# Patient Record
Sex: Female | Born: 1990 | Race: White | Hispanic: No | Marital: Single | Smoking: Never smoker
Health system: Southern US, Community
[De-identification: ages and names within clinical notes are randomized; demographics above are authoritative.]

---
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
## 2010-08-02 ENCOUNTER — Emergency Department (HOSPITAL_COMMUNITY): Admission: EM | Admit: 2010-08-02 | Discharge: 2010-08-02 | Payer: Self-pay | Admitting: Emergency Medicine

## 2011-04-23 ENCOUNTER — Other Ambulatory Visit: Payer: Self-pay | Admitting: Family Medicine

## 2013-07-01 ENCOUNTER — Other Ambulatory Visit (HOSPITAL_COMMUNITY)
Admission: RE | Admit: 2013-07-01 | Discharge: 2013-07-01 | Disposition: A | Discharge status: Home / Self Care | Payer: 59 | Source: Ambulatory Visit | Attending: Family Medicine | Admitting: Family Medicine

## 2013-07-01 ENCOUNTER — Other Ambulatory Visit: Payer: Self-pay | Admitting: Family Medicine

## 2013-07-01 DIAGNOSIS — Z124 Encounter for screening for malignant neoplasm of cervix: Secondary | ICD-10-CM | POA: Insufficient documentation

## 2013-07-01 DIAGNOSIS — Z1151 Encounter for screening for human papillomavirus (HPV): Secondary | ICD-10-CM | POA: Insufficient documentation

## 2016-11-02 DIAGNOSIS — R319 Hematuria, unspecified: Secondary | ICD-10-CM | POA: Diagnosis not present

## 2016-11-06 DIAGNOSIS — N6321 Unspecified lump in the left breast, upper outer quadrant: Secondary | ICD-10-CM | POA: Diagnosis not present

## 2016-12-11 DIAGNOSIS — L853 Xerosis cutis: Secondary | ICD-10-CM | POA: Diagnosis not present

## 2016-12-11 DIAGNOSIS — F419 Anxiety disorder, unspecified: Secondary | ICD-10-CM | POA: Diagnosis not present

## 2016-12-11 DIAGNOSIS — L709 Acne, unspecified: Secondary | ICD-10-CM | POA: Diagnosis not present

## 2016-12-11 DIAGNOSIS — F329 Major depressive disorder, single episode, unspecified: Secondary | ICD-10-CM | POA: Diagnosis not present

## 2016-12-11 DIAGNOSIS — E559 Vitamin D deficiency, unspecified: Secondary | ICD-10-CM | POA: Diagnosis not present

## 2017-01-08 DIAGNOSIS — F419 Anxiety disorder, unspecified: Secondary | ICD-10-CM | POA: Diagnosis not present

## 2017-01-08 DIAGNOSIS — L709 Acne, unspecified: Secondary | ICD-10-CM | POA: Diagnosis not present

## 2017-01-08 DIAGNOSIS — L853 Xerosis cutis: Secondary | ICD-10-CM | POA: Diagnosis not present

## 2017-05-09 DIAGNOSIS — L709 Acne, unspecified: Secondary | ICD-10-CM | POA: Diagnosis not present

## 2017-05-09 DIAGNOSIS — N6321 Unspecified lump in the left breast, upper outer quadrant: Secondary | ICD-10-CM | POA: Diagnosis not present

## 2017-05-09 DIAGNOSIS — N6312 Unspecified lump in the right breast, upper inner quadrant: Secondary | ICD-10-CM | POA: Diagnosis not present

## 2017-05-09 DIAGNOSIS — L853 Xerosis cutis: Secondary | ICD-10-CM | POA: Diagnosis not present

## 2017-05-09 DIAGNOSIS — E559 Vitamin D deficiency, unspecified: Secondary | ICD-10-CM | POA: Diagnosis not present

## 2017-05-09 DIAGNOSIS — F329 Major depressive disorder, single episode, unspecified: Secondary | ICD-10-CM | POA: Diagnosis not present

## 2017-05-09 DIAGNOSIS — F419 Anxiety disorder, unspecified: Secondary | ICD-10-CM | POA: Diagnosis not present

## 2017-05-14 DIAGNOSIS — F329 Major depressive disorder, single episode, unspecified: Secondary | ICD-10-CM | POA: Diagnosis not present

## 2017-05-14 DIAGNOSIS — L709 Acne, unspecified: Secondary | ICD-10-CM | POA: Diagnosis not present

## 2017-05-14 DIAGNOSIS — L853 Xerosis cutis: Secondary | ICD-10-CM | POA: Diagnosis not present

## 2017-05-14 DIAGNOSIS — F419 Anxiety disorder, unspecified: Secondary | ICD-10-CM | POA: Diagnosis not present

## 2017-08-05 DIAGNOSIS — J029 Acute pharyngitis, unspecified: Secondary | ICD-10-CM | POA: Diagnosis not present

## 2017-08-06 DIAGNOSIS — B349 Viral infection, unspecified: Secondary | ICD-10-CM | POA: Diagnosis not present

## 2017-12-19 ENCOUNTER — Other Ambulatory Visit: Payer: Self-pay | Admitting: Family Medicine

## 2017-12-19 ENCOUNTER — Other Ambulatory Visit (HOSPITAL_COMMUNITY)
Admission: RE | Admit: 2017-12-19 | Discharge: 2017-12-19 | Disposition: A | Discharge status: Home / Self Care | Payer: BLUE CROSS/BLUE SHIELD | Source: Ambulatory Visit | Attending: Family Medicine | Admitting: Family Medicine

## 2017-12-19 DIAGNOSIS — Z124 Encounter for screening for malignant neoplasm of cervix: Secondary | ICD-10-CM | POA: Diagnosis not present

## 2017-12-19 DIAGNOSIS — Z131 Encounter for screening for diabetes mellitus: Secondary | ICD-10-CM | POA: Diagnosis not present

## 2017-12-19 DIAGNOSIS — Z0000 Encounter for general adult medical examination without abnormal findings: Secondary | ICD-10-CM | POA: Diagnosis not present

## 2017-12-24 LAB — CYTOLOGY - PAP
DIAGNOSIS: NEGATIVE
HPV (WINDOPATH): NOT DETECTED

## 2018-05-15 DIAGNOSIS — N6310 Unspecified lump in the right breast, unspecified quadrant: Secondary | ICD-10-CM | POA: Diagnosis not present

## 2018-05-15 DIAGNOSIS — N6321 Unspecified lump in the left breast, upper outer quadrant: Secondary | ICD-10-CM | POA: Diagnosis not present

## 2018-06-02 DIAGNOSIS — D2272 Melanocytic nevi of left lower limb, including hip: Secondary | ICD-10-CM | POA: Diagnosis not present

## 2018-06-02 DIAGNOSIS — D2271 Melanocytic nevi of right lower limb, including hip: Secondary | ICD-10-CM | POA: Diagnosis not present

## 2018-06-02 DIAGNOSIS — L700 Acne vulgaris: Secondary | ICD-10-CM | POA: Diagnosis not present

## 2018-06-02 DIAGNOSIS — D225 Melanocytic nevi of trunk: Secondary | ICD-10-CM | POA: Diagnosis not present

## 2018-06-02 DIAGNOSIS — D485 Neoplasm of uncertain behavior of skin: Secondary | ICD-10-CM | POA: Diagnosis not present

## 2018-12-25 DIAGNOSIS — Z0000 Encounter for general adult medical examination without abnormal findings: Secondary | ICD-10-CM | POA: Diagnosis not present

## 2018-12-25 DIAGNOSIS — D241 Benign neoplasm of right breast: Secondary | ICD-10-CM | POA: Diagnosis not present

## 2018-12-25 DIAGNOSIS — G43909 Migraine, unspecified, not intractable, without status migrainosus: Secondary | ICD-10-CM | POA: Diagnosis not present

## 2018-12-25 DIAGNOSIS — F411 Generalized anxiety disorder: Secondary | ICD-10-CM | POA: Diagnosis not present

## 2018-12-25 DIAGNOSIS — Z79899 Other long term (current) drug therapy: Secondary | ICD-10-CM | POA: Diagnosis not present

## 2019-10-26 DIAGNOSIS — Z20828 Contact with and (suspected) exposure to other viral communicable diseases: Secondary | ICD-10-CM | POA: Diagnosis not present

## 2020-01-06 DIAGNOSIS — Z79899 Other long term (current) drug therapy: Secondary | ICD-10-CM | POA: Diagnosis not present

## 2020-01-06 DIAGNOSIS — Z0000 Encounter for general adult medical examination without abnormal findings: Secondary | ICD-10-CM | POA: Diagnosis not present

## 2020-01-06 DIAGNOSIS — R5383 Other fatigue: Secondary | ICD-10-CM | POA: Diagnosis not present

## 2020-02-22 DIAGNOSIS — R5383 Other fatigue: Secondary | ICD-10-CM | POA: Diagnosis not present

## 2020-02-22 DIAGNOSIS — K130 Diseases of lips: Secondary | ICD-10-CM | POA: Diagnosis not present

## 2020-02-22 DIAGNOSIS — E559 Vitamin D deficiency, unspecified: Secondary | ICD-10-CM | POA: Diagnosis not present

## 2020-02-22 DIAGNOSIS — E639 Nutritional deficiency, unspecified: Secondary | ICD-10-CM | POA: Diagnosis not present

## 2020-06-13 DIAGNOSIS — Z20828 Contact with and (suspected) exposure to other viral communicable diseases: Secondary | ICD-10-CM | POA: Diagnosis not present

## 2020-06-24 DIAGNOSIS — E559 Vitamin D deficiency, unspecified: Secondary | ICD-10-CM | POA: Diagnosis not present

## 2020-06-24 DIAGNOSIS — F419 Anxiety disorder, unspecified: Secondary | ICD-10-CM | POA: Diagnosis not present

## 2020-06-24 DIAGNOSIS — R5383 Other fatigue: Secondary | ICD-10-CM | POA: Diagnosis not present

## 2021-02-20 DIAGNOSIS — G43909 Migraine, unspecified, not intractable, without status migrainosus: Secondary | ICD-10-CM | POA: Diagnosis not present

## 2021-02-20 DIAGNOSIS — Z13220 Encounter for screening for lipoid disorders: Secondary | ICD-10-CM | POA: Diagnosis not present

## 2021-02-20 DIAGNOSIS — Z79899 Other long term (current) drug therapy: Secondary | ICD-10-CM | POA: Diagnosis not present

## 2021-02-20 DIAGNOSIS — Z0000 Encounter for general adult medical examination without abnormal findings: Secondary | ICD-10-CM | POA: Diagnosis not present

## 2021-03-22 DIAGNOSIS — D2262 Melanocytic nevi of left upper limb, including shoulder: Secondary | ICD-10-CM | POA: Diagnosis not present

## 2021-03-22 DIAGNOSIS — D2261 Melanocytic nevi of right upper limb, including shoulder: Secondary | ICD-10-CM | POA: Diagnosis not present

## 2021-03-22 DIAGNOSIS — D2221 Melanocytic nevi of right ear and external auricular canal: Secondary | ICD-10-CM | POA: Diagnosis not present

## 2021-03-22 DIAGNOSIS — D224 Melanocytic nevi of scalp and neck: Secondary | ICD-10-CM | POA: Diagnosis not present

## 2021-03-22 DIAGNOSIS — D225 Melanocytic nevi of trunk: Secondary | ICD-10-CM | POA: Diagnosis not present

## 2021-10-04 DIAGNOSIS — E559 Vitamin D deficiency, unspecified: Secondary | ICD-10-CM | POA: Diagnosis not present

## 2021-10-04 DIAGNOSIS — R454 Irritability and anger: Secondary | ICD-10-CM | POA: Diagnosis not present

## 2021-10-04 DIAGNOSIS — E538 Deficiency of other specified B group vitamins: Secondary | ICD-10-CM | POA: Diagnosis not present

## 2021-10-04 DIAGNOSIS — F419 Anxiety disorder, unspecified: Secondary | ICD-10-CM | POA: Diagnosis not present

## 2021-10-04 DIAGNOSIS — R5383 Other fatigue: Secondary | ICD-10-CM | POA: Diagnosis not present

## 2021-10-16 DIAGNOSIS — F419 Anxiety disorder, unspecified: Secondary | ICD-10-CM | POA: Diagnosis not present

## 2021-10-16 DIAGNOSIS — R5383 Other fatigue: Secondary | ICD-10-CM | POA: Diagnosis not present

## 2021-11-08 DIAGNOSIS — E538 Deficiency of other specified B group vitamins: Secondary | ICD-10-CM | POA: Diagnosis not present

## 2021-11-08 DIAGNOSIS — E612 Magnesium deficiency: Secondary | ICD-10-CM | POA: Diagnosis not present

## 2021-11-08 DIAGNOSIS — F419 Anxiety disorder, unspecified: Secondary | ICD-10-CM | POA: Diagnosis not present

## 2021-11-08 DIAGNOSIS — R5383 Other fatigue: Secondary | ICD-10-CM | POA: Diagnosis not present

## 2021-11-08 DIAGNOSIS — E559 Vitamin D deficiency, unspecified: Secondary | ICD-10-CM | POA: Diagnosis not present

## 2021-11-15 ENCOUNTER — Encounter: Payer: Self-pay | Admitting: Cardiovascular Disease

## 2021-11-15 ENCOUNTER — Ambulatory Visit: Payer: BC Managed Care – PPO | Admitting: Cardiovascular Disease

## 2021-11-15 ENCOUNTER — Other Ambulatory Visit: Payer: Self-pay

## 2021-11-15 VITALS — BP 108/60 | HR 104 | Ht 62.0 in | Wt 94.0 lb

## 2021-11-15 DIAGNOSIS — R072 Precordial pain: Secondary | ICD-10-CM

## 2021-11-15 NOTE — Patient Instructions (Signed)
Medication Instructions:  The current medical regimen is effective;  continue present plan and medications.  *If you need a refill on your cardiac medications before your next appointment, please call your pharmacy*  Lab: ESR, CRP today    Testing/Procedures: Echocardiogram - Your physician has requested that you have an echocardiogram. Echocardiography is a painless test that uses sound waves to create images of your heart. It provides your doctor with information about the size and shape of your heart and how well your hearts chambers and valves are working. This procedure takes approximately one hour. There are no restrictions for this procedure. This will be performed at either our [facility] location - [address], [place] location [facility] 2nd floor.    Follow-Up: At [facility], you and your health needs are our priority.  As part of our continuing mission to provide you with exceptional heart care, we have created designated Provider Care Teams.  These Care Teams include your primary Cardiologist (physician) and Advanced Practice Providers (APPs -  Physician Assistants and Nurse Practitioners) who all work together to provide you with the care you need, when you need it.  We recommend signing up for the patient portal called "MyChart".  Sign up information is provided on this After Visit Summary.  MyChart is used to connect with patients for Virtual Visits (Telemedicine).  Patients are able to view lab/test results, encounter notes, upcoming appointments, etc.  Non-urgent messages can be sent to your provider as well.   To learn more about what you can do with MyChart, go to [email or web address].    Your next appointment:   As needed  The format for your next appointment:   In Person  Provider:   Eleonore Chiquito, MD

## 2021-11-15 NOTE — Progress Notes (Signed)
Cardiology Office Note:   Date:  11/15/2021  NAME:  Kimberly Porter    MRN: [id number] DOB:  [birth year]   PCP:  Mayra Neer, MD  Cardiologist:  None  Electrophysiologist:  None   Referring MD: No ref. provider found   Chief Complaint  Patient presents with   Chest Pain    History of Present Illness:   Kimberly Porter is a 31 y.o. female without significant medical history who presents for evaluation of chest pain at the request of Serita Grammes, MD.  She reports she developed COVID-19 infection on 10/24/2021.  Symptoms were cough, fever and fatigue.  Symptoms of fever lasted roughly 3 days.  She did have cough and upper respiratory symptoms for roughly 5 to 7 days.  She reports she had no chest pain or no significant shortness of breath.  She reports she was fatigued and did a round of steroids as well as ivermectin HCQ.  She reports roughly 3 to 4 days ago she developed tightness in her chest.  She describes a squeezing sensation in her right chest.  She reports that symptoms would also occur in her arms as well as left chest.  She also describes squeezing sensation in her arms.  Can occur in legs as well.  She reports she has anxiety.  She reports she is more anxious with her symptoms.  She reports no positional component to the symptoms.  They apparently are occurring more at night.  She reports she is thinking more about her symptoms.  She denies any significant shortness of breath.  She has no lower extremity edema.  She denies any weight gain.  She was seen by functional medicine doctor in [place].  Laboratory work was unremarkable.  She has continued to have symptoms and is quite concerned she may have sinister pathology such as myocarditis or congestive heart failure.  Her EKG in office demonstrates sinus tachycardia heart rate 104.  She does report sensation of rapid heartbeat sensation when she has her symptoms.  She has this all the time with anxiety symptoms.  Nothing that  appears to be new.  Her EKG demonstrates an RSR prime pattern.  She has no evidence of abnormality on cardiovascular exam today.  She has no medical problems.  She does not smoke drink alcohol or use drugs.  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  She works part-time as a International aid/development worker.  She denies any other symptoms in office today.  She presents with her mother.  There is a family of history of heart disease in her grandparents.  She really has no medical troubles.  Past Medical History: History reviewed. No pertinent past medical history.  Past Surgical History: History reviewed. No pertinent surgical history.  Current Medications: No outpatient medications have been marked as taking for the 11/15/21 encounter (Office Visit) with Geralynn Rile, MD.     Allergies:    Patient has no allergy information on record.   Social History: Social History   Socioeconomic History   Marital status: Single    Spouse name: Not on file   Number of children: Not on file   Years of education: Not on file   Highest education level: Not on file  Occupational History   Occupation: Glass blower/designer  Tobacco Use   Smoking status: Never   Smokeless tobacco: Never  Substance and Sexual Activity   Alcohol use: Not Currently   Drug use: Never   Sexual activity: Not Currently  Other Topics Concern   Not on file  Social History Narrative   Not on file   Social Determinants of Health   Financial Resource Strain: Not on file  Food Insecurity: Not on file  Transportation Needs: Not on file  Physical Activity: Not on file  Stress: Not on file  Social Connections: Not on file     Family History: The patient's family history includes Heart disease in her maternal grandfather and maternal grandmother.  ROS:   All other ROS reviewed and negative. Pertinent positives noted in the HPI.    EKG: An EKG was ordered in the office today.  This EKG demonstrates sinus tachycardia heart rate  104, RSR prime pattern is present, normal finding in a young person.  This EKG was personally reviewed by me.  Recent Labs: No results found for requested labs within last 8760 hours.   Recent Lipid Panel No results found for: CHOL, TRIG, HDL, CHOLHDL, VLDL, LDLCALC, LDLDIRECT  Physical Exam:   VS:  BP 108/60    Pulse (!) 104    Ht '5\' 2"'  (1.575 m)    Wt 94 lb (42.6 kg)    SpO2 99%    BMI 17.19 kg/m    Wt Readings from Last 3 Encounters:  11/15/21 94 lb (42.6 kg)    General: Well nourished, well developed, in no acute distress Head: Atraumatic, normal size  Eyes: PEERLA, EOMI  Neck: Supple, no JVD Endocrine: No thryomegaly Cardiac: Normal S1, S2; RRR; no murmurs, rubs, or gallops Lungs: Clear to auscultation bilaterally, no wheezing, rhonchi or rales  Abd: Soft, nontender, no hepatomegaly  Ext: No edema, pulses 2+ Musculoskeletal: No deformities, BUE and BLE strength normal and equal Skin: Warm and dry, no rashes   Neuro: Alert and oriented to person, place, time, and situation, CNII-XII grossly intact, no focal deficits  Psych: Normal mood and affect   ASSESSMENT:   Kimberly Porter is a 31 y.o. female who presents for the following: 1. Precordial pain     PLAN:   1. Precordial pain -She describes chest symptoms after COVID-19 infection.  She really had upper respiratory infection symptoms.  She had no symptoms of chest pain or trouble breathing when she was originally infected.  She now describes nonspecific squeezing in her arms and legs as well as chest.  To me this is noncardiac chest pain.  She has no positional component to suggest pericarditis.  She has no evidence of congestive heart failure.  Her overall examination is normal.  Her EKG demonstrates an RSR prime pattern which is a normal finding in a young healthy person.  She has no pre-existing medical problems.  I believe her symptoms could be inflammatory related.  I would like to check an ESR and CRP.  We will also  check an echocardiogram just to make sure her heart has not been affected.  I highly doubt it will be abnormal.  For now she can take ibuprofen and Tylenol as needed.  If her inflammatory markers are elevated we may treat her for pericarditis however symptoms are inconsistent with this.  Further instruction will be after her lab work and echocardiogram.  For now I suspect this will resolve.    Disposition: Return in about 1 year (around 11/15/2022).  Medication Adjustments/Labs and Tests Ordered: Current medicines are reviewed at length with the patient today.  Concerns regarding medicines are outlined above.  Orders Placed This Encounter  Procedures   Sedimentation rate   C-reactive protein  EKG 12-Lead   ECHOCARDIOGRAM COMPLETE   No orders of the defined types were placed in this encounter.   Patient Instructions  Medication Instructions:  The current medical regimen is effective;  continue present plan and medications.  *If you need a refill on your cardiac medications before your next appointment, please call your pharmacy*  Lab: ESR, CRP today    Testing/Procedures: Echocardiogram - Your physician has requested that you have an echocardiogram. Echocardiography is a painless test that uses sound waves to create images of your heart. It provides your doctor with information about the size and shape of your heart and how well your hearts chambers and valves are working. This procedure takes approximately one hour. There are no restrictions for this procedure. This will be performed at either our [facility] location - [address], [place] location [facility] 2nd floor.    Follow-Up: At [facility], you and your health needs are our priority.  As part of our continuing mission to provide you with exceptional heart care, we have created designated Provider Care Teams.  These Care Teams include your primary Cardiologist (physician) and Advanced  Practice Providers (APPs -  Physician Assistants and Nurse Practitioners) who all work together to provide you with the care you need, when you need it.  We recommend signing up for the patient portal called "MyChart".  Sign up information is provided on this After Visit Summary.  MyChart is used to connect with patients for Virtual Visits (Telemedicine).  Patients are able to view lab/test results, encounter notes, upcoming appointments, etc.  Non-urgent messages can be sent to your provider as well.   To learn more about what you can do with MyChart, go to [email or web address].    Your next appointment:   As needed  The format for your next appointment:   In Person  Provider:   Eleonore Chiquito, MD      Signed, Addison Naegeli. Audie Box, MD, [place]  [address], [place], [place] [address] [phone]  11/15/2021 11:19 AM

## 2021-11-16 ENCOUNTER — Ambulatory Visit (INDEPENDENT_AMBULATORY_CARE_PROVIDER_SITE_OTHER): Payer: BC Managed Care – PPO

## 2021-11-16 DIAGNOSIS — R072 Precordial pain: Secondary | ICD-10-CM

## 2021-11-16 LAB — ECHOCARDIOGRAM COMPLETE
AR max vel: 2.04 cm2
AV Area VTI: 1.91 cm2
AV Area mean vel: 2.02 cm2
AV Mean grad: 5 mmHg
AV Peak grad: 8.9 mmHg
Ao pk vel: 1.49 m/s
Area-P 1/2: 5.88 cm2
Calc EF: 70.7 %
S' Lateral: 2.74 cm
Single Plane A2C EF: 63.3 %
Single Plane A4C EF: 78.3 %

## 2021-11-16 LAB — SEDIMENTATION RATE: Sed Rate: 2 mm/hr (ref 0–32)

## 2021-11-16 LAB — C-REACTIVE PROTEIN: CRP: <1 mg/L (ref 0–10)

## 2021-11-17 ENCOUNTER — Encounter: Payer: Self-pay | Admitting: Cardiovascular Disease

## 2021-11-21 ENCOUNTER — Other Ambulatory Visit (HOSPITAL_COMMUNITY): Payer: BC Managed Care – PPO

## 2021-12-26 DIAGNOSIS — R195 Other fecal abnormalities: Secondary | ICD-10-CM | POA: Diagnosis not present

## 2021-12-26 DIAGNOSIS — R634 Abnormal weight loss: Secondary | ICD-10-CM | POA: Diagnosis not present

## 2021-12-27 DIAGNOSIS — F411 Generalized anxiety disorder: Secondary | ICD-10-CM | POA: Diagnosis not present

## 2021-12-28 ENCOUNTER — Other Ambulatory Visit: Payer: Self-pay | Admitting: Internal Medicine

## 2021-12-28 DIAGNOSIS — R634 Abnormal weight loss: Secondary | ICD-10-CM | POA: Diagnosis not present

## 2021-12-28 DIAGNOSIS — R195 Other fecal abnormalities: Secondary | ICD-10-CM | POA: Diagnosis not present

## 2022-01-02 ENCOUNTER — Ambulatory Visit
Admission: RE | Admit: 2022-01-02 | Discharge: 2022-01-02 | Disposition: A | Discharge status: Home / Self Care | Payer: BC Managed Care – PPO | Source: Ambulatory Visit | Attending: Internal Medicine | Admitting: Internal Medicine

## 2022-01-02 DIAGNOSIS — K909 Intestinal malabsorption, unspecified: Secondary | ICD-10-CM | POA: Diagnosis not present

## 2022-01-02 DIAGNOSIS — R195 Other fecal abnormalities: Secondary | ICD-10-CM

## 2022-01-03 ENCOUNTER — Other Ambulatory Visit: Payer: BC Managed Care – PPO

## 2022-01-04 DIAGNOSIS — F411 Generalized anxiety disorder: Secondary | ICD-10-CM | POA: Diagnosis not present

## 2022-01-15 DIAGNOSIS — R5383 Other fatigue: Secondary | ICD-10-CM | POA: Diagnosis not present

## 2022-01-15 DIAGNOSIS — E538 Deficiency of other specified B group vitamins: Secondary | ICD-10-CM | POA: Diagnosis not present

## 2022-01-15 DIAGNOSIS — F419 Anxiety disorder, unspecified: Secondary | ICD-10-CM | POA: Diagnosis not present

## 2022-01-15 DIAGNOSIS — E559 Vitamin D deficiency, unspecified: Secondary | ICD-10-CM | POA: Diagnosis not present

## 2022-01-23 DIAGNOSIS — F411 Generalized anxiety disorder: Secondary | ICD-10-CM | POA: Diagnosis not present

## 2022-01-30 DIAGNOSIS — F411 Generalized anxiety disorder: Secondary | ICD-10-CM | POA: Diagnosis not present

## 2022-02-05 DIAGNOSIS — F411 Generalized anxiety disorder: Secondary | ICD-10-CM | POA: Diagnosis not present

## 2022-02-19 DIAGNOSIS — F419 Anxiety disorder, unspecified: Secondary | ICD-10-CM | POA: Diagnosis not present

## 2022-02-19 DIAGNOSIS — Z1329 Encounter for screening for other suspected endocrine disorder: Secondary | ICD-10-CM | POA: Diagnosis not present

## 2022-02-19 DIAGNOSIS — R5383 Other fatigue: Secondary | ICD-10-CM | POA: Diagnosis not present

## 2022-02-19 DIAGNOSIS — R140 Abdominal distension (gaseous): Secondary | ICD-10-CM | POA: Diagnosis not present

## 2022-02-26 DIAGNOSIS — F411 Generalized anxiety disorder: Secondary | ICD-10-CM | POA: Diagnosis not present

## 2022-03-06 DIAGNOSIS — Z803 Family history of malignant neoplasm of breast: Secondary | ICD-10-CM | POA: Diagnosis not present

## 2022-03-06 DIAGNOSIS — R922 Inconclusive mammogram: Secondary | ICD-10-CM | POA: Diagnosis not present

## 2022-03-08 DIAGNOSIS — F411 Generalized anxiety disorder: Secondary | ICD-10-CM | POA: Diagnosis not present

## 2022-03-12 DIAGNOSIS — F411 Generalized anxiety disorder: Secondary | ICD-10-CM | POA: Diagnosis not present

## 2022-03-19 DIAGNOSIS — F411 Generalized anxiety disorder: Secondary | ICD-10-CM | POA: Diagnosis not present

## 2022-03-29 DIAGNOSIS — F411 Generalized anxiety disorder: Secondary | ICD-10-CM | POA: Diagnosis not present

## 2022-04-06 DIAGNOSIS — F411 Generalized anxiety disorder: Secondary | ICD-10-CM | POA: Diagnosis not present

## 2022-04-18 DIAGNOSIS — F4010 Social phobia, unspecified: Secondary | ICD-10-CM | POA: Diagnosis not present

## 2022-04-18 DIAGNOSIS — F411 Generalized anxiety disorder: Secondary | ICD-10-CM | POA: Diagnosis not present

## 2022-04-20 DIAGNOSIS — E559 Vitamin D deficiency, unspecified: Secondary | ICD-10-CM | POA: Diagnosis not present

## 2022-04-20 DIAGNOSIS — F419 Anxiety disorder, unspecified: Secondary | ICD-10-CM | POA: Diagnosis not present

## 2022-04-20 DIAGNOSIS — E538 Deficiency of other specified B group vitamins: Secondary | ICD-10-CM | POA: Diagnosis not present

## 2022-04-20 DIAGNOSIS — R5383 Other fatigue: Secondary | ICD-10-CM | POA: Diagnosis not present

## 2022-04-20 DIAGNOSIS — Z1329 Encounter for screening for other suspected endocrine disorder: Secondary | ICD-10-CM | POA: Diagnosis not present

## 2022-05-04 DIAGNOSIS — F411 Generalized anxiety disorder: Secondary | ICD-10-CM | POA: Diagnosis not present

## 2022-05-17 DIAGNOSIS — F411 Generalized anxiety disorder: Secondary | ICD-10-CM | POA: Diagnosis not present

## 2022-06-01 DIAGNOSIS — F411 Generalized anxiety disorder: Secondary | ICD-10-CM | POA: Diagnosis not present

## 2022-06-05 ENCOUNTER — Ambulatory Visit (INDEPENDENT_AMBULATORY_CARE_PROVIDER_SITE_OTHER): Payer: BC Managed Care – PPO | Admitting: Behavioral Health

## 2022-06-05 ENCOUNTER — Encounter: Payer: Self-pay | Admitting: Behavioral Health

## 2022-06-05 VITALS — BP 108/65 | HR 77 | Ht 61.0 in | Wt 93.0 lb

## 2022-06-05 DIAGNOSIS — F411 Generalized anxiety disorder: Secondary | ICD-10-CM | POA: Diagnosis not present

## 2022-06-05 DIAGNOSIS — R636 Underweight: Secondary | ICD-10-CM

## 2022-06-05 DIAGNOSIS — F331 Major depressive disorder, recurrent, moderate: Secondary | ICD-10-CM

## 2022-06-05 DIAGNOSIS — F5105 Insomnia due to other mental disorder: Secondary | ICD-10-CM | POA: Diagnosis not present

## 2022-06-05 DIAGNOSIS — F99 Mental disorder, not otherwise specified: Secondary | ICD-10-CM

## 2022-06-05 MED ORDER — MIRTAZAPINE 15 MG PO TABS: 15.0000 mg | ORAL_TABLET | Freq: Every day | ORAL | 1 refills | Status: DC

## 2022-06-05 MED ORDER — SERTRALINE HCL 100 MG PO TABS: 100.0000 mg | ORAL_TABLET | Freq: Every day | ORAL | 1 refills | Status: DC

## 2022-06-05 NOTE — Progress Notes (Signed)
[facility] MD/PA/NP Initial Note  06/05/2022 5:36 PM ARDELLE HALIBURTON  MRN:  [id number]  Chief Complaint:  Chief Complaint   Depression; Anxiety; Establish Care; Medication Problem; Patient Education; Weight Loss     HPI:   "[name]", 31 year old female presents to this office for initial visit and to establish care.  Her mother is present with her during interview with her verbal consent.  She says that she has struggled with anxiety and depression since the fifth grade.  Says it progressively become worse throughout teen years, but she eventually stopped taking medication for a period pre-college. She then reinitiated medication again during her college years when strong anxiety resurfaced. She says that her anxiety always seem to get worse when she was in transitional periods of life.  She says her anxiety becomes overwhelming at times and she has experienced panic attacks previously.  Says that her anxiety has caused her to become fixated on various health issues.  Says that she once went to the doctor complaining of chest pains and after a full cardiac work-up, it was determined that it was anxiety and panic.  She later developed abdominal distress which she also now feels is associated with anxiety.  She has been experiencing steady weight loss, and is now underweight at 93 pounds.  She says that she knows that she is not getting the right kind of nutrition or the amount of calories needed to be healthy.  She also has chronic sleeping problems over the last few years which she says has also attributed to increased anxiety.  Her PHQ-9 score today was 11.  She says her anxiety is 7 of 10.  Her depression is 3 of 10.  Reports sleeping 6 to 7 hours of broken sleep per night.  She has had a few prior medication trials since then.  She was receiving some holistic care recently and taking only 37.5 mg Zoloft. Her PCP had prescribed some Xanax for panic but says that she really has not wanted to utilize.   Her MDQ was grossly negative only endorsing racing thoughts.  She denies any history of mania, no psychosis, no history of auditory or visual hallucinations.  Denies SI or HI.  Past psychotropic medications: Doxepin Paxil Zoloft   Visit Diagnosis:    ICD-10-CM   1. Generalized anxiety disorder  F41.1 sertraline (ZOLOFT) 100 MG tablet    2. Major depressive disorder, recurrent episode, moderate (HCC)  F33.1 sertraline (ZOLOFT) 100 MG tablet    3. Insomnia due to other mental disorder  F51.05 mirtazapine (REMERON) 15 MG tablet   F99     4. Underweight  R63.6       Past Psychiatric History: Anxiety, panic attacks, MDD, Insomnia, underweight.   Past Medical History: History reviewed. No pertinent past medical history. History reviewed. No pertinent surgical history.  Family Psychiatric History: see chart  Family History:  Family History  Problem Relation Age of Onset   Depression Mother    Anxiety disorder Mother    Alcohol abuse Father    ADD / ADHD Father    Drug abuse Paternal Aunt    Alcohol abuse Paternal Aunt    Anxiety disorder Maternal Grandfather    Heart disease Maternal Grandfather    Anxiety disorder Maternal Grandmother    Heart disease Maternal Grandmother    Drug abuse Paternal Grandmother    Alcohol abuse Paternal Grandmother     Social History:  Social History   Socioeconomic History   Marital status: Single  Spouse name: Not on file   Number of children: Not on file   Years of education: 16   Highest education level: Bachelor's degree (e.g., BA, AB, BS)  Occupational History   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 Enjoys cooking, being outdoors, enjoys [employer].   Social Determinants of Health   Financial Resource Strain: Not on file  Food Insecurity: Not on file  Transportation Needs: Not on file  Physical Activity: Not on file  Stress: Not on file  Social Connections: Not on file    Allergies: Not on File  Metabolic Disorder Labs: No results found for: "HGBA1C", "MPG" No results found for: "PROLACTIN" No results found for: "CHOL", "TRIG", "HDL", "CHOLHDL", "VLDL", "LDLCALC" No results found for: "TSH"  Therapeutic Level Labs: No results found for: "LITHIUM" No results found for: "VALPROATE" No results found for: "CBMZ"  Current Medications: Current Outpatient Medications  Medication Sig Dispense Refill   mirtazapine (REMERON) 15 MG tablet Take 1 tablet (15 mg total) by mouth at bedtime. 30 tablet 1   sertraline (ZOLOFT) 100 MG tablet Take 1 tablet (100 mg total) by mouth daily. 30 tablet 1   No current facility-administered medications for this visit.    Medication Side Effects: none  Orders placed this visit:  No orders of the defined types were placed in this encounter.   Psychiatric Specialty Exam:  Review of Systems  Constitutional:  Positive for appetite change, fatigue and unexpected weight change.  HENT:  Positive for tinnitus.   Eyes:  Positive for visual disturbance.  Gastrointestinal:  Positive for constipation, diarrhea and nausea.  Neurological:  Positive for dizziness.  Psychiatric/Behavioral:  Positive for dysphoric mood and sleep disturbance. The patient is nervous/anxious.     Blood pressure 108/65, pulse 77, height 5\' 1"  (1.549 m), weight 93 lb (42.2 kg).Body mass index is 17.57 kg/m.  General Appearance: Casual, Neat, and Well Groomed  Eye Contact:  Good  Speech:  Clear and Coherent  Volume:  Normal  Mood:  Anxious and Depressed  Affect:  Congruent, Depressed, Tearful, and Anxious  Thought Process:  Coherent  Orientation:   Full (Time, Place, and Person)  Thought Content: Logical   Suicidal Thoughts:  No  Homicidal Thoughts:  No  Memory:  WNL  Judgement:  Good  Insight:  Good  Psychomotor Activity:  Normal  Concentration:  Concentration: Good  Recall:  Good  Fund of Knowledge: Good  Language: Good  Assets:  Desire for Improvement  ADL's:  Intact  Cognition: WNL  Prognosis:  Good   Screenings:  PHQ2-9    Flowsheet Row Office Visit from 06/05/2022 in [facility]  PHQ-2 Total Score 3  PHQ-9 Total Score 11       Receiving Psychotherapy: yes  Treatment Plan/Recommendations:  Greater than 50% of 60 min face to face time with patient was spent on counseling and coordination of care. We discussed her long hx of anxiety and depression stemming back to 5th grade. We discussed how anxiety worsens at intervals in life where she is transitioning. Talked about her  previous plan of care and prior medications. Discussed her current level of stability and her goals for care today. We reviewed medications and possible side effects to be aware of.  We discussed her continued weight loss and nutritional intake. She is currently underweight at 93 lbs. We talked about her diet and discussed Mirtazapine as a medication that may help with sleep as well as stimulate appetite, and help gain some weight.   We agreed to: Will increase Zoloft to 50 mg for 1 week, then 100 mg daily To start Mirtazapine 15 mg 30 min before bedtime for sleep. To report worsening symptoms or side effects promptly To follow up in 4 weeks to reassess Provided emergency contact information Reviewed PDMP.   Joan Flores, NP

## 2022-06-12 ENCOUNTER — Telehealth: Payer: Self-pay | Admitting: Behavioral Health

## 2022-06-12 NOTE — Telephone Encounter (Signed)
Told mom to call pharmacy and ask them.

## 2022-06-12 NOTE — Telephone Encounter (Signed)
Mentioned to Arlys John and he informed safe to take these 2 meds together.

## 2022-06-12 NOTE — Telephone Encounter (Signed)
Pt's mom Cala Bradford on [address] called asking if Mirtazapine can be taken with Magnesium. Pt takes 300 mg @ night. [employer] @ [phone].

## 2022-06-14 DIAGNOSIS — F411 Generalized anxiety disorder: Secondary | ICD-10-CM | POA: Diagnosis not present

## 2022-06-20 ENCOUNTER — Telehealth: Payer: Self-pay | Admitting: Behavioral Health

## 2022-06-20 NOTE — Telephone Encounter (Signed)
Patient has been on 100 mg sertraline for one week. She describes several issues that she wonders if they are caused by sertraline.  She said one time she bent over and her heart started racing. She described it as a feeling like 2 hearts are beating together. States it only happened once so far and lasted for 5-10 minutes. She said when she gets up in the morning she feels better than she does after taking sertraline. She describes occasional burry vision. She said she is able to get to sleep with 1/2 mirtazapine, goes to bed around 10:30 but is only sleeping until 4-4:30 before waking. She gets up at 6:00 for work and sometimes is able to go back to sleep from 4:30 to 6:00. She describes being very shaky - if she held her arm out it would be shaking. She ? If this is a potential anxiety issue. She also describes GI issues. Food feels like a rock in her stomach, she alternates between constipation and diarrhea. This makes her more anxious because she doesn't know if she is doing something to cause it. She said she is in counseling. She states she has pretty much been anxious 24/7 for the past 7 months.

## 2022-06-20 NOTE — Telephone Encounter (Signed)
Patient Kimberly Porter requesting to discuss some symptoms she is experiencing. Patient is questioning if this could be side effects of the Sertraline.  Next appt 07/03/22  Contact information # [phone]

## 2022-06-20 NOTE — Telephone Encounter (Signed)
Please advise pt that these do not sound like serious side effects and usually most of what she described will pass in 1-2 weeks. Please have her try to power through one more week. If not better we may reduce dosage to 75 mg but we need to give a little more time. She can also try taking the full dose of mirtazapine at this point to see if it helps keep her asleep.

## 2022-06-20 NOTE — Telephone Encounter (Signed)
Patient notified of recommendations. 

## 2022-06-25 DIAGNOSIS — F411 Generalized anxiety disorder: Secondary | ICD-10-CM | POA: Diagnosis not present

## 2022-07-03 ENCOUNTER — Encounter: Payer: Self-pay | Admitting: Behavioral Health

## 2022-07-03 ENCOUNTER — Ambulatory Visit: Payer: BC Managed Care – PPO | Admitting: Behavioral Health

## 2022-07-03 DIAGNOSIS — F411 Generalized anxiety disorder: Secondary | ICD-10-CM | POA: Diagnosis not present

## 2022-07-03 DIAGNOSIS — F5105 Insomnia due to other mental disorder: Secondary | ICD-10-CM

## 2022-07-03 DIAGNOSIS — F331 Major depressive disorder, recurrent, moderate: Secondary | ICD-10-CM | POA: Diagnosis not present

## 2022-07-03 DIAGNOSIS — R636 Underweight: Secondary | ICD-10-CM

## 2022-07-03 DIAGNOSIS — F99 Mental disorder, not otherwise specified: Secondary | ICD-10-CM

## 2022-07-03 MED ORDER — MIRTAZAPINE 15 MG PO TABS: 15.0000 mg | ORAL_TABLET | Freq: Every day | ORAL | 1 refills | Status: DC

## 2022-07-03 MED ORDER — SERTRALINE HCL 100 MG PO TABS: 100.0000 mg | ORAL_TABLET | Freq: Every day | ORAL | 1 refills | Status: DC

## 2022-07-03 NOTE — Progress Notes (Signed)
[facility]  Patient ID: Kimberly Porter,  MRN: [id number]  PCP: Lupita Raider, MD  Date of Evaluation: 07/03/2022 Time spent:30 minutes  Chief Complaint:  Chief Complaint   Anxiety; Depression; Weight Loss; Medication Refill; Follow-up; Patient Education; Eating Disorder     HISTORY/CURRENT STATUS: HPI   "Kimberly Porter", 31 year old female presents to this office for follow up and medication management.  She feels like she has made minimal improvement since initiating medications. She is still concerned about her eating habits. She understands she probably is not getting enough calories to gain weight. Says that she doe not like how thin she is. She continues with psychotherapy.  Still hovering at 92-93 lbs.  She says that she knows that she is not getting the right kind of nutrition or the amount of calories needed to be healthy.  Sleep has improved with Mirtazapine but not noticing appetite increase yet.  She is open to medication adjustment today. She says her anxiety is 4 of 10.  Her depression is 3 of 10.  Reports sleeping 7  hours  sleep per night.  She denies any history of mania, no psychosis, no history of auditory or visual hallucinations.  Denies SI or HI.  Past psychotropic medications: Doxepin Paxil Zoloft      Individual Medical History/ Review of Systems: Changes? :No   Allergies: Patient has no allergy information on record.  Current Medications:  Current Outpatient Medications:    mirtazapine (REMERON) 15 MG tablet, Take 1 tablet (15 mg total) by mouth at bedtime., Disp: 30 tablet, Rfl: 1   sertraline (ZOLOFT) 100 MG tablet, Take 1 tablet (100 mg total) by mouth daily., Disp: 45 tablet, Rfl: 1   valACYclovir (VALTREX) 500 MG tablet, Take 500 mg by mouth 2 (two) times daily., Disp: , Rfl:  Medication Side Effects: none  Family Medical/ Social History: Changes? No  MENTAL HEALTH EXAM:  There were no vitals taken for this visit.There is no height or  weight on file to calculate BMI.  General Appearance: Casual and Well Groomed  Eye Contact:  Good  Speech:  Clear and Coherent  Volume:  Normal  Mood:  Depressed  Affect:  Appropriate  Thought Process:  Coherent  Orientation:  Full (Time, Place, and Person)  Thought Content: Logical   Suicidal Thoughts:  No  Homicidal Thoughts:  No  Memory:  WNL  Judgement:  Good  Insight:  Good  Psychomotor Activity:  Normal  Concentration:  Concentration: Good  Recall:  Good  Fund of Knowledge: Good  Language: Good  Assets:  Desire for Improvement  ADL's:  Intact  Cognition: WNL  Prognosis:  Good    DIAGNOSES:    ICD-10-CM   1. Underweight  R63.6     2. Generalized anxiety disorder  F41.1 sertraline (ZOLOFT) 100 MG tablet    3. Major depressive disorder, recurrent episode, moderate (HCC)  F33.1 sertraline (ZOLOFT) 100 MG tablet    4. Insomnia due to other mental disorder  F51.05 mirtazapine (REMERON) 15 MG tablet   F99       Receiving Psychotherapy: No    RECOMMENDATIONS:   Greater than 50% of 30 min face to face time with patient was spent on counseling and coordination of care. We discussed her minimal improvement since last visit. She is still obsessing over what types of food she consumes. Not getting enough calories. She did not gain any weight since last visit. Is sleeping better but has not noticed appetite increase with Mirtazapine yet.  Feels like anxiety has improved slightly. We reviewed medications and possible side effects to be aware of. We reviewed her diet again. She is to try to increase healthy carbohydrates and protein intake.  We agreed to: Will increase Zoloft to 150 mg  this week.  To start Mirtazapine 15 mg 30 min before bedtime for sleep. To report worsening symptoms or side effects promptly To follow up in 4 weeks to reassess Provided emergency contact information Reviewed PDMP.  Joan Flores, NP

## 2022-07-10 DIAGNOSIS — F411 Generalized anxiety disorder: Secondary | ICD-10-CM | POA: Diagnosis not present

## 2022-07-26 DIAGNOSIS — F411 Generalized anxiety disorder: Secondary | ICD-10-CM | POA: Diagnosis not present

## 2022-07-31 ENCOUNTER — Ambulatory Visit: Payer: BC Managed Care – PPO | Admitting: Behavioral Health

## 2022-08-02 DIAGNOSIS — Z713 Dietary counseling and surveillance: Secondary | ICD-10-CM | POA: Diagnosis not present

## 2022-08-08 ENCOUNTER — Telehealth (INDEPENDENT_AMBULATORY_CARE_PROVIDER_SITE_OTHER): Payer: BC Managed Care – PPO | Admitting: Behavioral Health

## 2022-08-08 ENCOUNTER — Encounter: Payer: Self-pay | Admitting: Behavioral Health

## 2022-08-08 DIAGNOSIS — F5082 Avoidant/restrictive food intake disorder: Secondary | ICD-10-CM

## 2022-08-08 DIAGNOSIS — F411 Generalized anxiety disorder: Secondary | ICD-10-CM

## 2022-08-08 DIAGNOSIS — F99 Mental disorder, not otherwise specified: Secondary | ICD-10-CM

## 2022-08-08 DIAGNOSIS — F331 Major depressive disorder, recurrent, moderate: Secondary | ICD-10-CM | POA: Diagnosis not present

## 2022-08-08 DIAGNOSIS — F5105 Insomnia due to other mental disorder: Secondary | ICD-10-CM | POA: Diagnosis not present

## 2022-08-08 DIAGNOSIS — R636 Underweight: Secondary | ICD-10-CM | POA: Diagnosis not present

## 2022-08-08 NOTE — Progress Notes (Signed)
Kimberly Porter [id number] [birth year] 31 y.o.  Virtual Visit via Video Note  I connected with pt @ on 08/08/22 at  3:00 PM EDT by a video enabled telemedicine application and verified that I am speaking with the correct person using two identifiers.   I discussed the limitations of evaluation and management by telemedicine and the availability of in person appointments. The patient expressed understanding and agreed to proceed.  I discussed the assessment and treatment plan with the patient. The patient was provided an opportunity to ask questions and all were answered. The patient agreed with the plan and demonstrated an understanding of the instructions.   The patient was advised to call back or seek an in-person evaluation if the symptoms worsen or if the condition fails to improve as anticipated.  I provided 30 minutes of non-face-to-face time during this encounter.  The patient was located at home.  The provider was located at [place].   Elwanda Brooklyn, NP   Subjective:   Patient ID:  Kimberly Porter is a 31 y.o. (DOB [birth year]) female.  Chief Complaint:  Chief Complaint  Patient presents with   Depression   Eating Disorder   Anxiety   Follow-up   Medication Refill   Medication Problem   Patient Education    HPI "Kimberly Porter", 31 year old female presents to this office vid video visit for follow up and medication management.  Not much change since last visit. She feels like she has made moderate improvement since initiating medications. She is still concerned about her eating habits. She did visit nutritionist. She understands she probably is not getting enough calories to gain weight. Says that she doe not like how thin she is. She continues with psychotherapy.  Report weight at 97 lbs currently.  .  Sleep has improved with Mirtazapine but not noticing appetite increase yet. She has been hesitant to increase Mirtazapine to 15 mg.  She says her anxiety is 3 of 10.   Her depression is 3 of 10.  Reports sleeping 7  hours  sleep per night.  She denies any history of mania, no psychosis, no history of auditory or visual hallucinations.  Denies SI or HI.  Past psychotropic medications: Doxepin Paxil Zoloft          Review of Systems:  Review of Systems  Constitutional: Negative.   Allergic/Immunologic: Negative.   Neurological: Negative.   Psychiatric/Behavioral:  The patient is nervous/anxious.     Medications: I have reviewed the patient's current medications.  Current Outpatient Medications  Medication Sig Dispense Refill   mirtazapine (REMERON) 15 MG tablet Take 1 tablet (15 mg total) by mouth at bedtime. 30 tablet 1   sertraline (ZOLOFT) 100 MG tablet Take 1 tablet (100 mg total) by mouth daily. 45 tablet 1   valACYclovir (VALTREX) 500 MG tablet Take 500 mg by mouth 2 (two) times daily.     No current facility-administered medications for this visit.    Medication Side Effects: None  Allergies: No Known Allergies  History reviewed. No pertinent past medical history.  Family History  Problem Relation Age of Onset   Depression Mother    Anxiety disorder Mother    Alcohol abuse Father    ADD / ADHD Father    Drug abuse Paternal Aunt    Alcohol abuse Paternal Aunt    Anxiety disorder Maternal Grandfather    Heart disease Maternal Grandfather    Anxiety disorder Maternal Grandmother    Heart disease Maternal Grandmother  Drug abuse Paternal Grandmother    Alcohol abuse Paternal Grandmother     Social History   Socioeconomic History   Marital status: Single    Spouse name: Not on file   Number of children: Not on file   Years of education: 16   Highest education level: Bachelor's degree (e.g., BA, AB, BS)  Occupational History   Occupation: Glass blower/designer    Comment: Catering manager  Tobacco Use   Smoking status: Never   Smokeless tobacco: Never  Vaping Use   Vaping Use: Never used  Substance and Sexual Activity    Alcohol use: Yes    Comment: light social drinker   Drug use: Never   Sexual activity: Not Currently  Other Topics Concern   Not on file  Social History Narrative   Lives with mother and grandmother. Enjoys cooking, being outdoors, enjoys [employer].   Social Determinants of Health   Financial Resource Strain: Not on file  Food Insecurity: Not on file  Transportation Needs: Not on file  Physical Activity: Not on file  Stress: Not on file  Social Connections: Not on file  Intimate Partner Violence: Not on file    Past Medical History, Surgical history, Social history, and Family history were reviewed and updated as appropriate.   Please see review of systems for further details on the patient's review from today.   Objective:   Physical Exam:  There were no vitals taken for this visit.  Physical Exam Psychiatric:        Attention and Perception: Attention and perception normal.        Mood and Affect: Mood and affect normal.        Speech: Speech normal.        Behavior: Behavior normal. Behavior is cooperative.        Cognition and Memory: Cognition and memory normal.        Judgment: Judgment normal.     Lab Review:  No results found for: "NA", "K", "CL", "CO2", "GLUCOSE", "BUN", "CREATININE", "CALCIUM", "PROT", "ALBUMIN", "AST", "ALT", "ALKPHOS", "BILITOT", "GFRNONAA", "GFRAA"  No results found for: "WBC", "RBC", "HGB", "HCT", "PLT", "MCV", "MCH", "MCHC", "RDW", "LYMPHSABS", "MONOABS", "EOSABS", "BASOSABS"  No results found for: "POCLITH", "LITHIUM"   No results found for: "PHENYTOIN", "PHENOBARB", "VALPROATE", "CBMZ"   .res Assessment: Plan:    Greater than 50% of 30 min video visit with patient was spent on counseling and coordination of care. We discussed her moderate improvement since last visit. She is still obsessing over what types of food she consumes. She met with nutritionist and was told to consume more healthier carbs.  She reports weight at 97 lbs  this visit. Is sleeping better but has not noticed appetite increase with Mirtazapine yet. She is only taking 7.5 mg. Feels like anxiety has improved slightly. We reviewed medications and possible side effects to be aware of. We reviewed her diet again. She is to try to increase healthy carbohydrates and protein intake. We continued to discuss barrier to taking medication as recommended. We agreed to: Will increase Zoloft to 150 mg  this week.  To increase Mirtazapine 15 mg 30 min before bedtime for sleep. To report worsening symptoms or side effects promptly To follow up in 8 weeks to reassess Provided emergency contact information Reviewed PDMP.   There are no diagnoses linked to this encounter.   Please see After Visit Summary for patient specific instructions.  No future appointments.  No orders of the defined types were  placed in this encounter.     -------------------------------

## 2022-08-09 DIAGNOSIS — F411 Generalized anxiety disorder: Secondary | ICD-10-CM | POA: Diagnosis not present

## 2022-08-14 ENCOUNTER — Other Ambulatory Visit: Payer: Self-pay | Admitting: Behavioral Health

## 2022-08-14 DIAGNOSIS — F5105 Insomnia due to other mental disorder: Secondary | ICD-10-CM

## 2022-08-24 DIAGNOSIS — F411 Generalized anxiety disorder: Secondary | ICD-10-CM | POA: Diagnosis not present

## 2022-08-28 ENCOUNTER — Other Ambulatory Visit: Payer: Self-pay | Admitting: Behavioral Health

## 2022-08-28 DIAGNOSIS — F411 Generalized anxiety disorder: Secondary | ICD-10-CM

## 2022-08-28 DIAGNOSIS — F331 Major depressive disorder, recurrent, moderate: Secondary | ICD-10-CM

## 2022-09-06 DIAGNOSIS — F411 Generalized anxiety disorder: Secondary | ICD-10-CM | POA: Diagnosis not present

## 2022-09-14 DIAGNOSIS — F411 Generalized anxiety disorder: Secondary | ICD-10-CM | POA: Diagnosis not present

## 2022-10-05 ENCOUNTER — Encounter: Payer: Self-pay | Admitting: Behavioral Health

## 2022-10-05 ENCOUNTER — Ambulatory Visit (INDEPENDENT_AMBULATORY_CARE_PROVIDER_SITE_OTHER): Payer: BC Managed Care – PPO | Admitting: Behavioral Health

## 2022-10-05 ENCOUNTER — Other Ambulatory Visit: Payer: Self-pay | Admitting: Behavioral Health

## 2022-10-05 DIAGNOSIS — F5105 Insomnia due to other mental disorder: Secondary | ICD-10-CM

## 2022-10-05 DIAGNOSIS — F331 Major depressive disorder, recurrent, moderate: Secondary | ICD-10-CM | POA: Diagnosis not present

## 2022-10-05 DIAGNOSIS — R636 Underweight: Secondary | ICD-10-CM

## 2022-10-05 DIAGNOSIS — F411 Generalized anxiety disorder: Secondary | ICD-10-CM

## 2022-10-05 DIAGNOSIS — F5082 Avoidant/restrictive food intake disorder: Secondary | ICD-10-CM

## 2022-10-05 DIAGNOSIS — F99 Mental disorder, not otherwise specified: Secondary | ICD-10-CM

## 2022-10-05 NOTE — Progress Notes (Signed)
[facility]  Patient ID: Kimberly Porter,  MRN: [id number]  PCP: Kimberly Neer, MD  Date of Evaluation: 10/05/2022 Time spent:30 minutes  Chief Complaint:  Chief Complaint   Depression; Anxiety; Weight Loss; Follow-up; Medication Problem; Patient Education; Medication Refill     HISTORY/CURRENT STATUS: HPI  Kimberly Porter", 31 year old female presents to this office vid video visit for follow up and medication management.  Not much change since last visit. She feels like she has made moderate improvement since initiating medications.  She would like to continue with increasing her Mirtazapine and Zoloft after the holidays. Says that work is to stressful right now and she would like to wait.   Says she has finally had two back to back periods since putting on a couple of pounds. She previously was not experiencing menses. She continues with psychotherapy.  Report weight at 98.6 lbs this visit. She was 97 lbs previously.  Sleep has improved with Mirtazapine but not noticing appetite increase yet. She has been hesitant to increase Mirtazapine to 15 mg.  She says her anxiety is 3 of 10.  Her depression is 2of 10.  Reports sleeping 7  hours  sleep per night.  She denies any history of mania, no psychosis, no history of auditory or visual hallucinations.  Denies SI or HI.  Past psychotropic medications: Doxepin Paxil Zoloft    Individual Medical History/ Review of Systems: Changes? :No   Allergies: Patient has no known allergies.  Current Medications:  Current Outpatient Medications:    mirtazapine (REMERON) 15 MG tablet, TAKE 1 TABLET BY MOUTH NIGHTLY AT BEDTIME, Disp: 30 tablet, Rfl: 1   sertraline (ZOLOFT) 100 MG tablet, TAKE 1 TABLET BY MOUTH DAILY, Disp: 45 tablet, Rfl: 1   valACYclovir (VALTREX) 500 MG tablet, Take 500 mg by mouth 2 (two) times daily., Disp: , Rfl:  Medication Side Effects: none  Family Medical/ Social History: Changes? No  MENTAL HEALTH EXAM:  There  were no vitals taken for this visit.There is no height or weight on file to calculate BMI.  General Appearance: Casual, Neat, and Well Groomed  Eye Contact:  Good  Speech:  Clear and Coherent  Volume:  Normal  Mood:  NA  Affect:  Appropriate  Thought Process:  Coherent  Orientation:  Full (Time, Place, and Person)  Thought Content: Logical   Suicidal Thoughts:  No  Homicidal Thoughts:  No  Memory:  WNL  Judgement:  Good  Insight:  Good  Psychomotor Activity:  Normal  Concentration:  Concentration: Good  Recall:  Good  Fund of Knowledge: Good  Language: Good  Assets:  Desire for Improvement  ADL's:  Intact  Cognition: WNL  Prognosis:  Good    DIAGNOSES: No diagnosis found.  Receiving Psychotherapy: No    RECOMMENDATIONS:   Greater than 50% of 30 min video visit with patient was spent on counseling and coordination of care. We discussed her moderate improvement since last visit. We discussed her reported increased stress at work over the holidays, She doe not want to modify her medications until after the holidays and things have settled down. She met with nutritionist and was told to consume more healthier carbs.  She reports weight at 98 lbs this visit. Is sleeping better but has not noticed appetite increase with Mirtazapine yet. She is only taking 7.5 mg. Feels like anxiety has improved slightly. We reviewed medications and possible side effects to be aware of. We reviewed her diet again. She is to try to increase  healthy carbohydrates and protein intake. We continued to discuss barrier to taking medication as recommended. We will talk about increasing dosage next visit per pt.  We agreed to: To continue Zoloft to 100 mg  this week.  To continue Mirtazapine 7.5 mg 30 min before bedtime for sleep. To report worsening symptoms or side effects promptly To follow up in 8 weeks to reassess Provided emergency contact information Reviewed PDMP.     Kimberly Brooklyn, NP

## 2022-10-09 DIAGNOSIS — F411 Generalized anxiety disorder: Secondary | ICD-10-CM | POA: Diagnosis not present

## 2022-10-17 DIAGNOSIS — F411 Generalized anxiety disorder: Secondary | ICD-10-CM | POA: Diagnosis not present

## 2022-11-27 ENCOUNTER — Other Ambulatory Visit: Payer: Self-pay | Admitting: Behavioral Health

## 2022-11-27 DIAGNOSIS — F411 Generalized anxiety disorder: Secondary | ICD-10-CM

## 2022-11-27 DIAGNOSIS — F331 Major depressive disorder, recurrent, moderate: Secondary | ICD-10-CM

## 2022-11-30 ENCOUNTER — Ambulatory Visit: Payer: BC Managed Care – PPO | Admitting: Behavioral Health

## 2022-12-14 ENCOUNTER — Ambulatory Visit: Payer: 59 | Admitting: Behavioral Health

## 2022-12-14 ENCOUNTER — Encounter: Payer: Self-pay | Admitting: Behavioral Health

## 2022-12-14 DIAGNOSIS — F411 Generalized anxiety disorder: Secondary | ICD-10-CM

## 2022-12-14 DIAGNOSIS — F5082 Avoidant/restrictive food intake disorder: Secondary | ICD-10-CM | POA: Diagnosis not present

## 2022-12-14 DIAGNOSIS — R636 Underweight: Secondary | ICD-10-CM | POA: Diagnosis not present

## 2022-12-14 DIAGNOSIS — F331 Major depressive disorder, recurrent, moderate: Secondary | ICD-10-CM

## 2022-12-14 MED ORDER — SERTRALINE HCL 100 MG PO TABS: 100.0000 mg | ORAL_TABLET | Freq: Every day | ORAL | 3 refills | Status: DC

## 2022-12-14 NOTE — Progress Notes (Signed)
[facility]  Patient ID: Kimberly Porter,  MRN: [id number]  PCP: Mayra Neer, MD  Date of Evaluation: 12/14/2022 Time spent:30 minutes  Chief Complaint:  Chief Complaint   Depression; Anxiety; Follow-up; Eating Disorder; Medication Problem; Patient Education; Medication Refill     HISTORY/CURRENT STATUS: HPI "Kimberly Porter", 32 year old female presents to this  for follow up and medication management.  Not much change since last visit. She feels like she has made moderate improvement since initiating medications. She is still concerned about her eating habits.  She continues with psychotherapy.  Report weight at 97 lbs currently.   Sleep has improved with Mirtazapine but not noticing appetite increase yet.  She still has been hesitant to increase Mirtazapine to 15 mg.  She says that her anxiety and rumination has increased recently and feels like it may be time to increase her Zoloft.  She says her anxiety is 7 of 10.  Her depression is 2 of 10.  Reports sleeping 7  hours  sleep per night.  She denies any history of mania, no psychosis, no history of auditory or visual hallucinations.  Denies SI or HI.  Past psychotropic medications: Doxepin Paxil Zoloft    Individual Medical History/ Review of Systems: Changes? :No   Allergies: Patient has no known allergies.  Current Medications:  Current Outpatient Medications:    sertraline (ZOLOFT) 100 MG tablet, Take 1 tablet (100 mg total) by mouth daily., Disp: 45 tablet, Rfl: 3   mirtazapine (REMERON) 15 MG tablet, TAKE 1 TABLET BY MOUTH NIGHTLY AT BEDTIME, Disp: 30 tablet, Rfl: 1   sertraline (ZOLOFT) 100 MG tablet, TAKE 1 TABLET BY MOUTH DAILY, Disp: 45 tablet, Rfl: 0   valACYclovir (VALTREX) 500 MG tablet, Take 500 mg by mouth 2 (two) times daily., Disp: , Rfl:  Medication Side Effects: none  Family Medical/ Social History: Changes? No  MENTAL HEALTH EXAM:  Last menstrual period 11/25/2022.There is no height or weight  on file to calculate BMI.  General Appearance: Casual, Neat, and Well Groomed  Eye Contact:  Good  Speech:  Clear and Coherent  Volume:  Normal  Mood:  Anxious and Depressed  Affect:  Appropriate  Thought Process:  Coherent  Orientation:  Full (Time, Place, and Person)  Thought Content: Logical   Suicidal Thoughts:  No  Homicidal Thoughts:  No  Memory:  WNL  Judgement:  Good  Insight:  Good  Psychomotor Activity:  Normal  Concentration:  Concentration: Good  Recall:  Good  Fund of Knowledge: Good  Language: Good  Assets:  Desire for Improvement  ADL's:  Intact  Cognition: WNL  Prognosis:  Good    DIAGNOSES:    ICD-10-CM   1. Generalized anxiety disorder  F41.1 sertraline (ZOLOFT) 100 MG tablet    2. 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 Major depressive disorder, recurrent episode, moderate (HCC)  F33.1 sertraline (ZOLOFT) 100 MG tablet    4. Underweight  R63.6       Receiving Psychotherapy: No    RECOMMENDATIONS:   Greater than 50% of 30 min video visit with patient was spent on counseling and coordination of care. We discussed her moderate improvement since last visit but she complains of increasing anxiety and rumination.    Her weight is  98 lbs this visit. Is sleeping better but has not noticed appetite increase with Mirtazapine yet. She is only taking 7.5 mg. Feels like anxiety has improved slightly. We reviewed medications and possible side  effects to be aware of.  We agreed to: Will increase Zoloft to 150 mg  this week.  To increase Mirtazapine 15 mg 30 min before bedtime for sleep. To report worsening symptoms or side effects promptly To follow up in 8 weeks to reassess Provided emergency contact information Reviewed PDMP.   Elwanda Brooklyn, NP

## 2023-02-12 ENCOUNTER — Ambulatory Visit: Payer: BC Managed Care – PPO | Admitting: Behavioral Health

## 2023-03-01 ENCOUNTER — Encounter: Payer: Self-pay | Admitting: Behavioral Health

## 2023-03-01 ENCOUNTER — Ambulatory Visit: Payer: 59 | Admitting: Behavioral Health

## 2023-03-01 DIAGNOSIS — F331 Major depressive disorder, recurrent, moderate: Secondary | ICD-10-CM

## 2023-03-01 DIAGNOSIS — F5105 Insomnia due to other mental disorder: Secondary | ICD-10-CM | POA: Diagnosis not present

## 2023-03-01 DIAGNOSIS — F99 Mental disorder, not otherwise specified: Secondary | ICD-10-CM

## 2023-03-01 DIAGNOSIS — F411 Generalized anxiety disorder: Secondary | ICD-10-CM

## 2023-03-01 MED ORDER — MIRTAZAPINE 15 MG PO TABS: ORAL_TABLET | ORAL | 2 refills | Status: DC

## 2023-03-01 MED ORDER — SERTRALINE HCL 100 MG PO TABS: 150.0000 mg | ORAL_TABLET | Freq: Every day | ORAL | 2 refills | Status: DC

## 2023-03-01 NOTE — Progress Notes (Signed)
[facility]  Patient ID: Kimberly Porter,  MRN: [id number]  PCP: Kimberly Raider, MD  Date of Evaluation: 03/01/2023 Time spent:30 minutes  Chief Complaint:  Chief Complaint   Depression; Anxiety; Follow-up; Patient Education; Medication Refill     HISTORY/CURRENT STATUS: HPI  "Kimberly Porter", 32 year old female presents to this  for follow up and medication management.  Not much change since last visit. Says she is having some mood fluctuations. Depression and anxiety are partially controlled. Says that She is still trying to overcome medication barriers to increase her dose of zoloft.  Says she has gained about 10 lbs since last visit. She is happy with job. Got new promotion and raise.  She continues with psychotherapy.  Report weight at 105 lbs currently.   Sleep has improved with Mirtazapine but not noticing appetite increase yet.  She still has been hesitant to increase Mirtazapine to 15 mg.  She says that her anxiety and rumination has increased recently and feels like it may be time to increase her Zoloft.  She says her anxiety is 6 of 10.  Her depression is 2 of 10.  Reports sleeping 7  hours  sleep per night.  She denies any history of mania, no psychosis, no history of auditory or visual hallucinations.  Denies SI or HI.  Past psychotropic medications: Doxepin Paxil Zoloft     Individual Medical History/ Review of Systems: Changes? :No   Allergies: Patient has no known allergies.  Current Medications:  Current Outpatient Medications:    mirtazapine (REMERON) 15 MG tablet, TAKE 1 TABLET BY MOUTH NIGHTLY AT BEDTIME, Disp: 30 tablet, Rfl: 2   sertraline (ZOLOFT) 100 MG tablet, Take 1 tablet (100 mg total) by mouth daily., Disp: 45 tablet, Rfl: 3   sertraline (ZOLOFT) 100 MG tablet, Take 1.5 tablets (150 mg total) by mouth daily., Disp: 45 tablet, Rfl: 2   valACYclovir (VALTREX) 500 MG tablet, Take 500 mg by mouth 2 (two) times daily., Disp: , Rfl:  Medication Side  Effects: none  Family Medical/ Social History: Changes? No  MENTAL HEALTH EXAM:  There were no vitals taken for this visit.There is no height or weight on file to calculate BMI.  General Appearance: Casual, Neat, and Well Groomed  Eye Contact:  Good  Speech:  Clear and Coherent  Volume:  Normal  Mood:  Anxious, Depressed, and Dysphoric  Affect:  Appropriate  Thought Process:  Coherent  Orientation:  Full (Time, Place, and Person)  Thought Content: Logical   Suicidal Thoughts:  No  Homicidal Thoughts:  No  Memory:  WNL  Judgement:  Good  Insight:  Good  Psychomotor Activity:  Normal  Concentration:  Concentration: Good  Recall:  Good  Fund of Knowledge: Good  Language: Good  Assets:  Desire for Improvement  ADL's:  Intact  Cognition: WNL  Prognosis:  Good    DIAGNOSES:    ICD-10-CM   1. Insomnia due to other mental disorder  F51.05 mirtazapine (REMERON) 15 MG tablet   F99     2. Major depressive disorder, recurrent episode, moderate (HCC)  F33.1 sertraline (ZOLOFT) 100 MG tablet    3. Generalized anxiety disorder  F41.1 sertraline (ZOLOFT) 100 MG tablet      Receiving Psychotherapy: No    RECOMMENDATIONS:   Greater than 50% of 30 min video visit with patient was spent on counseling and coordination of care. We discussed her moderate improvement since last visit but she complains of increasing anxiety and rumination.  Her weight is  105 lbs this visit. Is sleeping better but has not noticed appetite increase with Mirtazapine yet. She is only taking 7.5 mg. Talked about her barriers and fears to taking more medication.  Feels like anxiety has improved slightly. We reviewed medications and possible side effects to be aware of.  We agreed to: Will increase Zoloft to 150 mg  this week.  To increase Mirtazapine 15 mg 30 min before bedtime for sleep. To report worsening symptoms or side effects promptly To follow up in 8 weeks to reassess Provided emergency contact  information Reviewed PDMP.       Kimberly Flores, NP

## 2023-04-29 ENCOUNTER — Other Ambulatory Visit: Payer: Self-pay | Admitting: Behavioral Health

## 2023-04-29 DIAGNOSIS — F411 Generalized anxiety disorder: Secondary | ICD-10-CM

## 2023-04-29 DIAGNOSIS — F5105 Insomnia due to other mental disorder: Secondary | ICD-10-CM

## 2023-04-29 DIAGNOSIS — F331 Major depressive disorder, recurrent, moderate: Secondary | ICD-10-CM

## 2023-05-07 ENCOUNTER — Encounter: Payer: Self-pay | Admitting: Behavioral Health

## 2023-05-07 ENCOUNTER — Ambulatory Visit (INDEPENDENT_AMBULATORY_CARE_PROVIDER_SITE_OTHER): Payer: 59 | Admitting: Behavioral Health

## 2023-05-07 DIAGNOSIS — R636 Underweight: Secondary | ICD-10-CM | POA: Diagnosis not present

## 2023-05-07 DIAGNOSIS — F411 Generalized anxiety disorder: Secondary | ICD-10-CM

## 2023-05-07 DIAGNOSIS — F331 Major depressive disorder, recurrent, moderate: Secondary | ICD-10-CM | POA: Diagnosis not present

## 2023-05-07 NOTE — Progress Notes (Signed)
Kimberly Porter [id number] 1991-02-19 31 y.o.  Virtual Visit via Telephone Note  I connected with pt on 05/07/23 at  8:30 AM EDT by telephone and verified that I am speaking with the correct person using two identifiers.   I discussed the limitations, risks, security and privacy concerns of performing an evaluation and management service by telephone and the availability of in person appointments. I also discussed with the patient that there may be a patient responsible charge related to this service. The patient expressed understanding and agreed to proceed.   I discussed the assessment and treatment plan with the patient. The patient was provided an opportunity to ask questions and all were answered. The patient agreed with the plan and demonstrated an understanding of the instructions.   The patient was advised to call back or seek an in-person evaluation if the symptoms worsen or if the condition fails to improve as anticipated.  I provided 20  minutes of non-face-to-face time during this encounter.  The patient was located at home.  The provider was located at [facility].   Joan Flores, NP   Subjective:   Patient ID:  Kimberly Porter is a 32 y.o. (DOB [birth year]) female.  Chief Complaint:  Chief Complaint  Patient presents with   Anxiety   Depression   Follow-up   Medication Refill    HPI  "Kimberly Porter", 32 year old female presents to this  for follow up and medication management.  Not much change since last visit. She did finally increase her Zoloft to 150 mg.  Says that she does feel much better. Says she has gained about 10 lbs since last visit.  She continues with psychotherapy.  Report weight at 105-110 lbs currently. .  She says her anxiety is 4 of 10.  Her depression is 2 of 10.  Reports sleeping 7  hours  sleep per night.  She denies any history of mania, no psychosis, no history of auditory or visual hallucinations.  Denies SI or HI.  Past psychotropic  medications: Doxepin Paxil Zoloft   Review of Systems:  Review of Systems  Constitutional: Negative.   Allergic/Immunologic: Negative.   Neurological: Negative.   Psychiatric/Behavioral:  Positive for dysphoric mood.     Medications: I have reviewed the patient's current medications.  Current Outpatient Medications  Medication Sig Dispense Refill   mirtazapine (REMERON) 15 MG tablet TAKE 1 TABLET BY MOUTH AT BEDTIME 30 tablet 0   sertraline (ZOLOFT) 100 MG tablet TAKE ONE AND A HALF TABLETS BY MOUTH ONCE DAILY 45 tablet 0   sertraline (ZOLOFT) 100 MG tablet Take 1 tablet (100 mg total) by mouth daily. 45 tablet 3   valACYclovir (VALTREX) 500 MG tablet Take 500 mg by mouth 2 (two) times daily.     No current facility-administered medications for this visit.    Medication Side Effects: None  Allergies: No Known Allergies  No past medical history on file.  Family History  Problem Relation Age of Onset   Depression Mother    Anxiety disorder Mother    Alcohol abuse Father    ADD / ADHD Father    Drug abuse Paternal Aunt    Alcohol abuse Paternal Aunt    Anxiety disorder Maternal Grandfather    Heart disease Maternal Grandfather    Anxiety disorder Maternal Grandmother    Heart disease Maternal Grandmother    Drug abuse Paternal Grandmother    Alcohol abuse Paternal Grandmother     Social History   Socioeconomic  History   Marital status: Single    Spouse name: Not on file   Number of children: Not on file   Years of education: 16   Highest education level: Bachelor's degree (e.g., BA, AB, BS)  Occupational History   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 Enjoys cooking, being outdoors, enjoys [employer].   Social Determinants of Health   Financial Resource Strain: Not on file  Food Insecurity: Not on file  Transportation Needs: Not on file  Physical Activity: Not on file  Stress: Not on file  Social Connections: Not on file  Intimate Partner Violence: Not on file    Past Medical History, Surgical history, Social history, and Family history were reviewed and updated as appropriate.   Please see review of systems for further details on the patient's review from today.   Objective:   Physical Exam:  There were no vitals taken for this visit.  Physical Exam Psychiatric:        Attention and Perception: Attention and perception normal.        Mood and Affect: Mood and affect normal.        Speech: Speech normal.        Behavior: Behavior normal. Behavior is cooperative.        Cognition and Memory: Cognition and memory normal.        Judgment: Judgment normal.     Lab Review:  No results found for: "NA", "K", "CL", "CO2", "GLUCOSE", "BUN", "CREATININE", "CALCIUM", "PROT", "ALBUMIN", "AST", "ALT", "ALKPHOS", "BILITOT", "GFRNONAA", "GFRAA"  No results found for: "WBC", "RBC", "HGB", "HCT", "PLT", "MCV", "MCH", "MCHC", "RDW", "LYMPHSABS", "MONOABS", "EOSABS", "BASOSABS"  No results found for: "POCLITH", "LITHIUM"   No results found for: "PHENYTOIN", "PHENOBARB", "VALPROATE", "CBMZ"   .res Assessment: Plan:    Greater than 50% of 20 min telephonic interview with patient was spent on counseling and coordination of care. We discussed her moderate improvement significant improvement since increasing zoloft. Reports less rumination. Reports weight between 105-110. Is sleeping better but has not noticed appetite increase with Mirtazapine yet. Feels like anxiety has improved. We reviewed medications and possible side effects to be aware of.  We agreed to: Continue  Zoloft 150 mg daily.   To continue  Mirtazapine 15 mg 30 min before bedtime for sleep. To report worsening symptoms or side effects promptly To follow up in 12 weeks to reassess Provided emergency contact information Reviewed PDMP.   There are no diagnoses linked to this encounter.  Please see After Visit Summary for patient specific instructions.  No future appointments.  No orders of the defined types were placed in this encounter.     -------------------------------

## 2023-06-25 ENCOUNTER — Other Ambulatory Visit: Payer: Self-pay | Admitting: Behavioral Health

## 2023-06-25 DIAGNOSIS — F331 Major depressive disorder, recurrent, moderate: Secondary | ICD-10-CM

## 2023-06-25 DIAGNOSIS — F411 Generalized anxiety disorder: Secondary | ICD-10-CM

## 2023-07-29 ENCOUNTER — Other Ambulatory Visit: Payer: Self-pay | Admitting: Behavioral Health

## 2023-07-29 DIAGNOSIS — F331 Major depressive disorder, recurrent, moderate: Secondary | ICD-10-CM

## 2023-07-29 DIAGNOSIS — F411 Generalized anxiety disorder: Secondary | ICD-10-CM

## 2023-08-06 ENCOUNTER — Encounter: Payer: Self-pay | Admitting: Behavioral Health

## 2023-08-06 ENCOUNTER — Ambulatory Visit (INDEPENDENT_AMBULATORY_CARE_PROVIDER_SITE_OTHER): Payer: 59 | Admitting: Behavioral Health

## 2023-08-06 DIAGNOSIS — F419 Anxiety disorder, unspecified: Secondary | ICD-10-CM | POA: Diagnosis not present

## 2023-08-06 DIAGNOSIS — R636 Underweight: Secondary | ICD-10-CM

## 2023-08-06 DIAGNOSIS — F32A Depression, unspecified: Secondary | ICD-10-CM | POA: Diagnosis not present

## 2023-08-06 DIAGNOSIS — F331 Major depressive disorder, recurrent, moderate: Secondary | ICD-10-CM

## 2023-08-06 DIAGNOSIS — F411 Generalized anxiety disorder: Secondary | ICD-10-CM

## 2023-08-06 NOTE — Progress Notes (Signed)
Kimberly Porter [id number] 02-02-1991 32 y.o.  Virtual Visit via Telephone Note  I connected with pt on 08/06/23 at  8:00 AM EDT by telephone and verified that I am speaking with the correct person using two identifiers.   I discussed the limitations, risks, security and privacy concerns of performing an evaluation and management service by telephone and the availability of in person appointments. I also discussed with the patient that there may be a patient responsible charge related to this service. The patient expressed understanding and agreed to proceed.   I discussed the assessment and treatment plan with the patient. The patient was provided an opportunity to ask questions and all were answered. The patient agreed with the plan and demonstrated an understanding of the instructions.   The patient was advised to call back or seek an in-person evaluation if the symptoms worsen or if the condition fails to improve as anticipated.  I provided 30 minutes of non-face-to-face time during this encounter.  The patient was located at home.  The provider was located at [facility].   Joan Flores, NP   Subjective:   Patient ID:  CHRISSY Porter is a 32 y.o. (DOB Mar 29, 1991) female.  Chief Complaint:  Chief Complaint  Patient presents with   Anxiety   Depression   Follow-up   Patient Education   Medication Refill    HPI "[name]", 32 year old female presents to this  for follow up and medication management.  Not much change since last visit. She continue with Zoloft 150 mg.  Says that she does feel much better and has enjoyed a period of good stability. She continues with psychotherapy.  Report weight at 105-110 lbs currently. She says her anxiety is 3 of 10.  Her depression is 2 of 10.  Reports sleeping 7  hours  sleep per night.  She denies any history of mania, no psychosis, no history of auditory or visual hallucinations.  Denies SI or HI.  Past psychotropic  medications: Doxepin Paxil Zoloft      Review of Systems:  Review of Systems  Constitutional: Negative.   Allergic/Immunologic: Negative.   Psychiatric/Behavioral: Negative.      Medications: I have reviewed the patient's current medications.  Current Outpatient Medications  Medication Sig Dispense Refill   mirtazapine (REMERON) 15 MG tablet TAKE 1 TABLET BY MOUTH AT BEDTIME 30 tablet 0   sertraline (ZOLOFT) 100 MG tablet TAKE ONE AND A HALF TABLETS BY MOUTH ONCE DAILY 45 tablet 0   sertraline (ZOLOFT) 100 MG tablet Take 1 tablet (100 mg total) by mouth daily. 45 tablet 3   valACYclovir (VALTREX) 500 MG tablet Take 500 mg by mouth 2 (two) times daily.     No current facility-administered medications for this visit.    Medication Side Effects: None  Allergies: No Known Allergies  History reviewed. No pertinent past medical history.  Family History  Problem Relation Age of Onset   Depression Mother    Anxiety disorder Mother    Alcohol abuse Father    ADD / ADHD Father    Drug abuse Paternal Aunt    Alcohol abuse Paternal Aunt    Anxiety disorder Maternal Grandfather    Heart disease Maternal Grandfather    Anxiety disorder Maternal Grandmother    Heart disease Maternal Grandmother    Drug abuse Paternal Grandmother    Alcohol abuse Paternal Grandmother     Social History   Socioeconomic History   Marital status: Single  Spouse name: Not on file   Number of children: Not on file   Years of education: 16   Highest education level: Bachelor's degree (e.g., BA, AB, BS)  Occupational History   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 Enjoys cooking, being outdoors, enjoys [employer].   Social Determinants of Health   Financial Resource Strain: Not on file  Food Insecurity: Not on file  Transportation Needs: Not on file  Physical Activity: Not on file  Stress: Not on file  Social Connections: Not on file  Intimate Partner Violence: Not on file    Past Medical History, Surgical history, Social history, and Family history were reviewed and updated as appropriate.   Please see review of systems for further details on the patient's review from today.   Objective:   Physical Exam:  There were no vitals taken for this visit.  Physical Exam Neurological:     Mental Status: She is alert and oriented to person, place, and time.  Psychiatric:        Attention and Perception: Attention and perception normal.        Mood and Affect: Mood and affect normal.        Speech: Speech normal.        Behavior: Behavior normal. Behavior is cooperative.        Cognition and Memory: Cognition and memory normal.        Judgment: Judgment normal.     Comments: Insight intact     Lab Review:  No results found for: "NA", "K", "CL", "CO2", "GLUCOSE", "BUN", "CREATININE", "CALCIUM", "PROT", "ALBUMIN", "AST", "ALT", "ALKPHOS", "BILITOT", "GFRNONAA", "GFRAA"  No results found for: "WBC", "RBC", "HGB", "HCT", "PLT", "MCV", "MCH", "MCHC", "RDW", "LYMPHSABS", "MONOABS", "EOSABS", "BASOSABS"  No results found for: "POCLITH", "LITHIUM"   No results found for: "PHENYTOIN", "PHENOBARB", "VALPROATE", "CBMZ"   .res Assessment: Plan:    Greater than 50% of 30 min telephonic interview with patient was spent on counseling and coordination of care. We discussed her continued improvement.   Reports less rumination. Still reports weight between 105-110. Is sleeping better but has not noticed appetite increase with Mirtazapine yet. Feels like anxiety has improved. 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  We  reviewed medications and possible side effects to be aware of.  We agreed to: Continue  Zoloft 150 mg daily.  To continue  Mirtazapine 15 mg 30 min before bedtime for sleep. To report worsening symptoms or side effects promptly To follow up in 12 weeks to reassess Provided emergency contact information Reviewed PDMP.   Arlys John A. Jalilah Wiltsie, NP   There are no diagnoses linked to this encounter.  Please see After Visit Summary for patient specific instructions.  No future appointments.  No orders of the defined types were placed in this encounter.     -------------------------------

## 2023-08-29 ENCOUNTER — Other Ambulatory Visit: Payer: Self-pay | Admitting: Behavioral Health

## 2023-08-29 DIAGNOSIS — F5105 Insomnia due to other mental disorder: Secondary | ICD-10-CM

## 2023-09-27 ENCOUNTER — Other Ambulatory Visit: Payer: Self-pay | Admitting: Behavioral Health

## 2023-09-27 DIAGNOSIS — F411 Generalized anxiety disorder: Secondary | ICD-10-CM

## 2023-09-27 DIAGNOSIS — F331 Major depressive disorder, recurrent, moderate: Secondary | ICD-10-CM

## 2023-09-30 ENCOUNTER — Telehealth: Payer: Self-pay | Admitting: Behavioral Health

## 2023-09-30 NOTE — Telephone Encounter (Signed)
[name] from [facility] lvm at 9:59 regarding prescription for Kimberly Porter. She is following up to see of prescription is approved. Ph: [phone]

## 2023-09-30 NOTE — Telephone Encounter (Signed)
Called pharmacy and [name] said she got the Rx, was sent yesterday.

## 2023-11-11 ENCOUNTER — Encounter: Payer: Self-pay | Admitting: Behavioral Health

## 2023-11-11 ENCOUNTER — Ambulatory Visit: Payer: 59 | Admitting: Behavioral Health

## 2023-11-11 DIAGNOSIS — F331 Major depressive disorder, recurrent, moderate: Secondary | ICD-10-CM | POA: Diagnosis not present

## 2023-11-11 DIAGNOSIS — F99 Mental disorder, not otherwise specified: Secondary | ICD-10-CM

## 2023-11-11 DIAGNOSIS — F5105 Insomnia due to other mental disorder: Secondary | ICD-10-CM

## 2023-11-11 DIAGNOSIS — F411 Generalized anxiety disorder: Secondary | ICD-10-CM | POA: Diagnosis not present

## 2023-11-11 MED ORDER — MIRTAZAPINE 15 MG PO TABS: ORAL_TABLET | ORAL | 1 refills | Status: DC

## 2023-11-11 MED ORDER — SERTRALINE HCL 100 MG PO TABS: 150.0000 mg | ORAL_TABLET | Freq: Every day | ORAL | 1 refills | Status: DC

## 2023-11-11 NOTE — Progress Notes (Signed)
 [facility]  Patient ID: Kimberly Porter,  MRN: [id number]  PCP: Loreli Kins, MD  Date of Evaluation: 11/11/2023 Time spent:30 minutes  Chief Complaint:  Chief Complaint   Anxiety; Depression; Follow-up; Medication Refill; Patient Education     HISTORY/CURRENT STATUS: HPI  75, 33 year old female presents to this  for follow up and medication management.  Not much change since last visit. She continue with Zoloft  150 mg.  Says that she does feel much better and has enjoyed a period of good stability. She continues with psychotherapy.  Says she has been busy with work the last few month and probably has been eating less. She has lost back down to 97 lbs. She says her anxiety is 2/10.  Her depression is 2/10.  Reports sleeping 7  hours  sleep per night.  She denies any history of mania, no psychosis, no history of auditory or visual hallucinations.  Denies SI or HI.  Past psychotropic medications: Doxepin Paxil Zoloft    Individual Medical History/ Review of Systems: Changes? :No   Allergies: Patient has no known allergies.  Current Medications:  Current Outpatient Medications:    mirtazapine  (REMERON ) 15 MG tablet, TAKE 1 TABLET BY MOUTH NIGHTLY AT BEDTIME, Disp: 90 tablet, Rfl: 1   sertraline  (ZOLOFT ) 100 MG tablet, Take 1 tablet (100 mg total) by mouth daily., Disp: 45 tablet, Rfl: 3   sertraline  (ZOLOFT ) 100 MG tablet, Take 1.5 tablets (150 mg total) by mouth daily., Disp: 135 tablet, Rfl: 1   valACYclovir (VALTREX) 500 MG tablet, Take 500 mg by mouth 2 (two) times daily., Disp: , Rfl:  Medication Side Effects: none  Family Medical/ Social History: Changes? No  MENTAL HEALTH EXAM:  Weight 97 lb (44 kg).Body mass index is 18.33 kg/m.  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 Major depressive disorder, recurrent episode, moderate (HCC)  F33.1 sertraline  (ZOLOFT ) 100 MG tablet    3. Generalized anxiety disorder  F41.1 sertraline  (ZOLOFT ) 100 MG tablet      Receiving Psychotherapy: No    RECOMMENDATIONS:   Greater than 50% of 30 min telephonic interview with patient was spent on counseling and coordination of care. We discussed her continued stability.   Reports less rumination. Lost her weight back that she had gain. 97 lbs today. Is sleeping better but has not noticed appetite increase with Mirtazapine  yet. Feels like anxiety has improved. 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  We reviewed medications and possible side effects to be aware of.  We agreed to: Continue  Zoloft  150 mg daily.  To continue  Mirtazapine  15 mg 30 min before bedtime for sleep. She reports taking 1/2 tablet 7.5 mg. To report worsening symptoms or side effects  promptly To follow up in 6 months to reassess Provided emergency contact information Reviewed PDMP.      Redell DELENA Pizza, NP

## 2024-03-06 IMAGING — US US ABDOMEN LIMITED
1 series · 14 of 25 positions shown · non-contrast
Comparison: None.

CLINICAL DATA: Passage of loose stools with fatty food, assess
gallbladder.

EXAM:
ULTRASOUND ABDOMEN LIMITED RIGHT UPPER QUADRANT

[Series 1: us abdomen limited · 0.19mm/px · 14 of 45 slices shown]
[im 1/45]
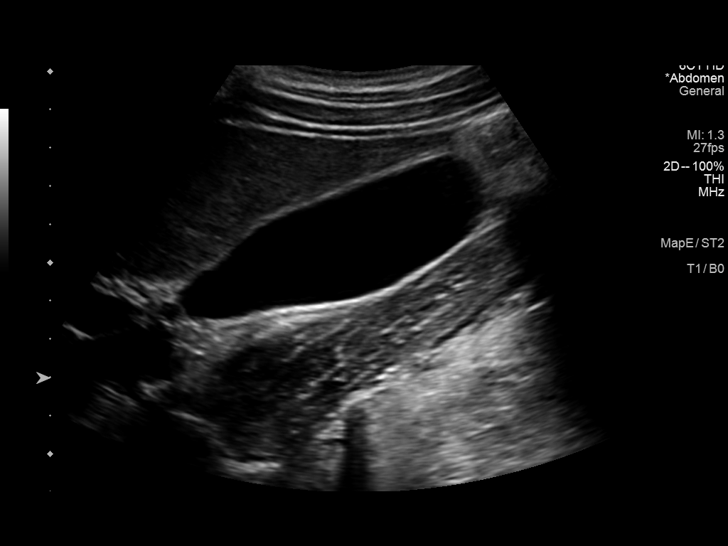
[im 4/45]
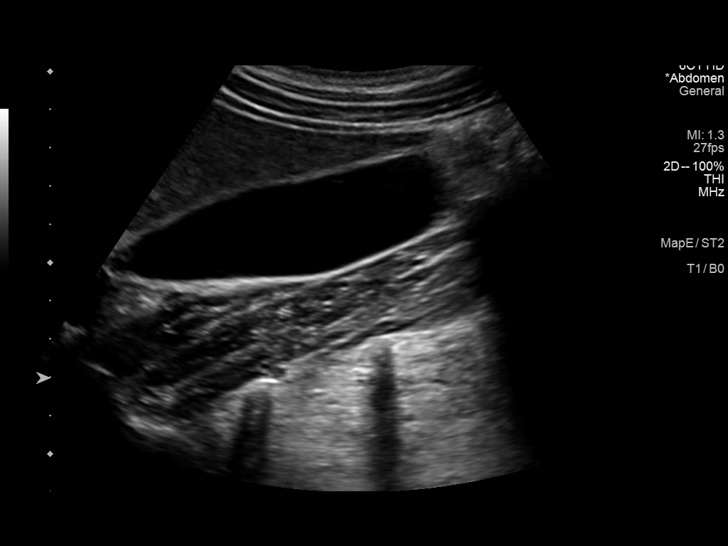
[im 8/45]
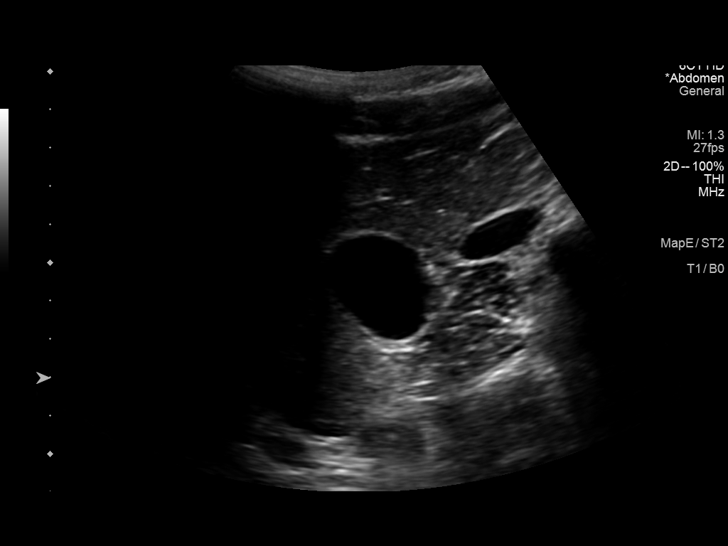
[im 12/45]
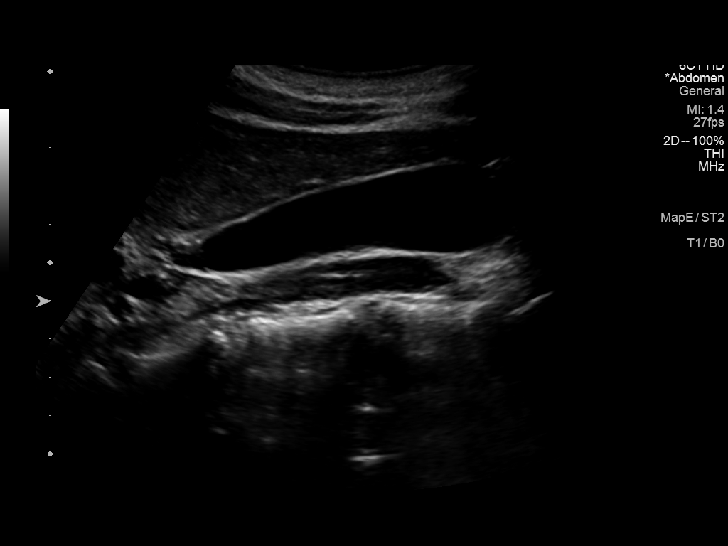
[im 15/45]
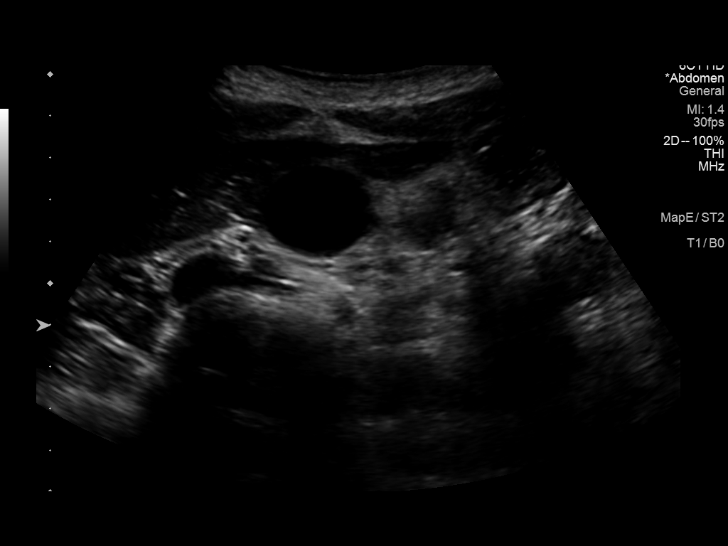
[im 17/45]
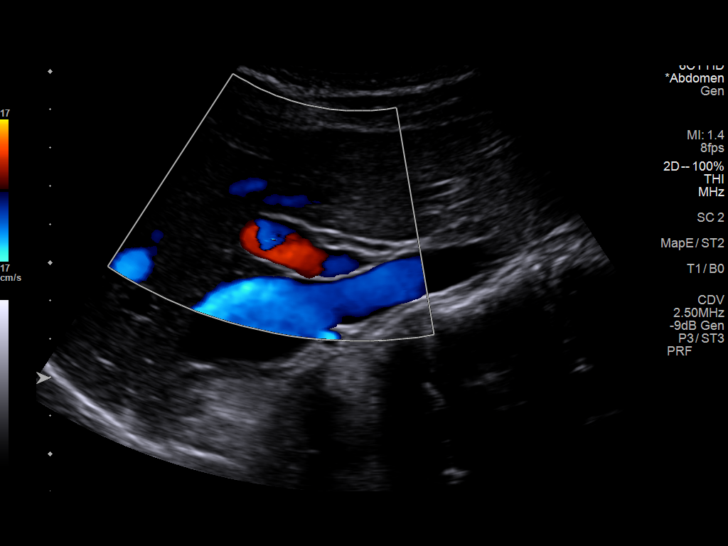
[im 21/45]
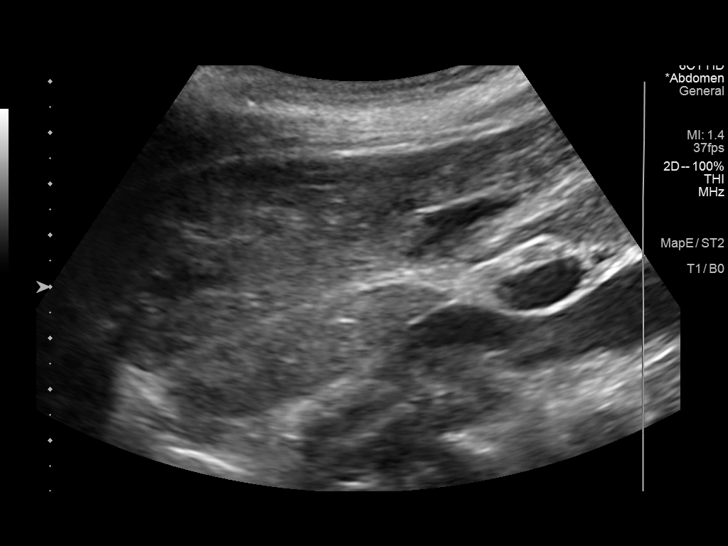
[im 24/45]
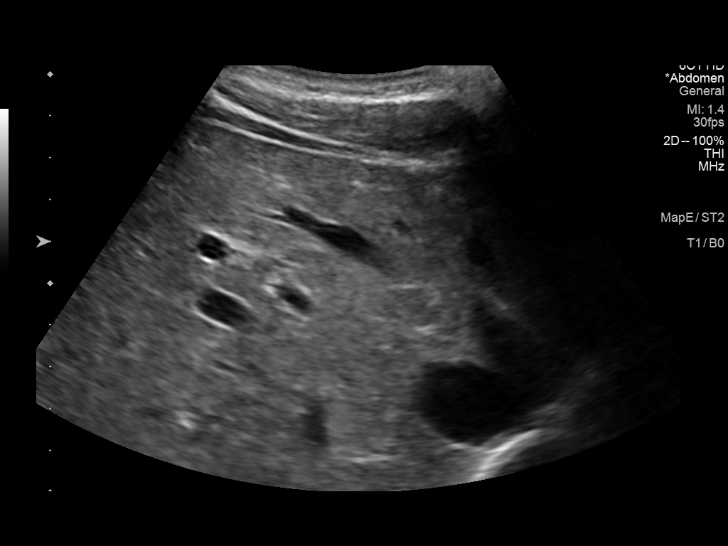
[im 28/45]
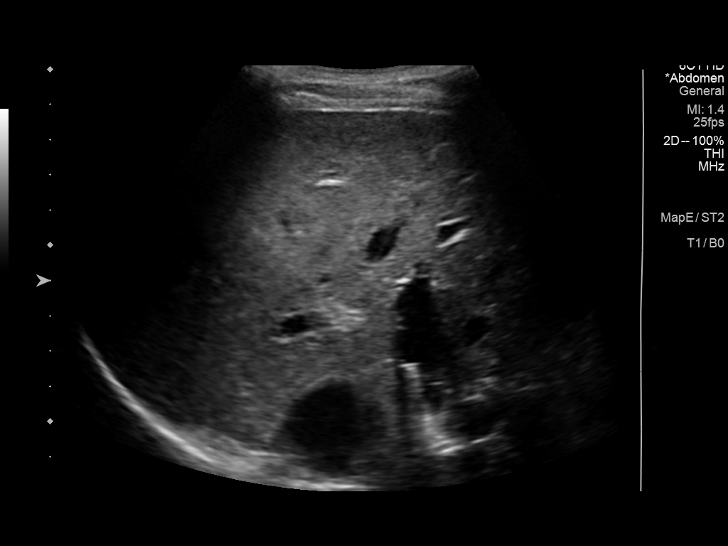
[im 30/45]
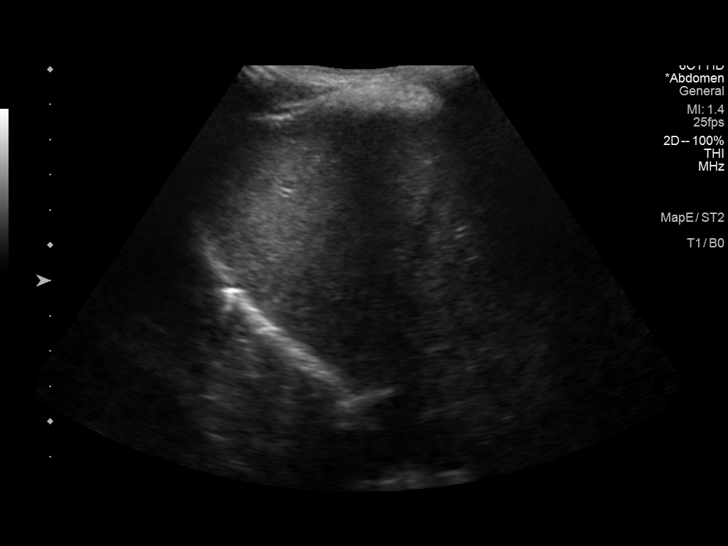
[im 34/45]
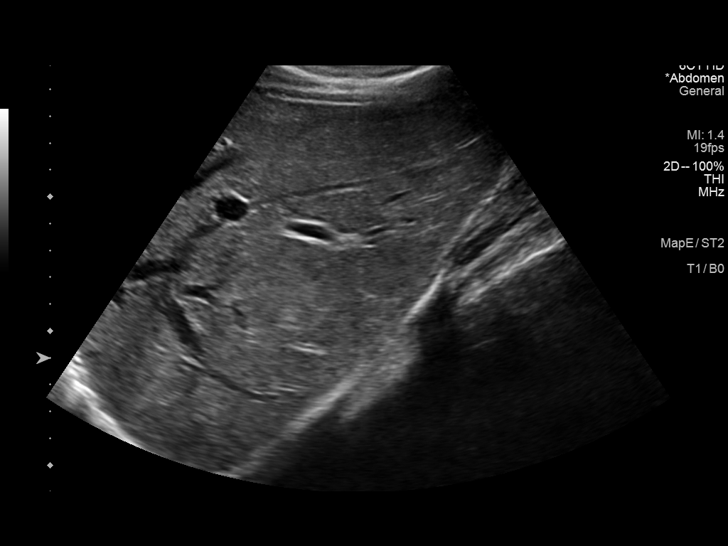
[im 37/45]
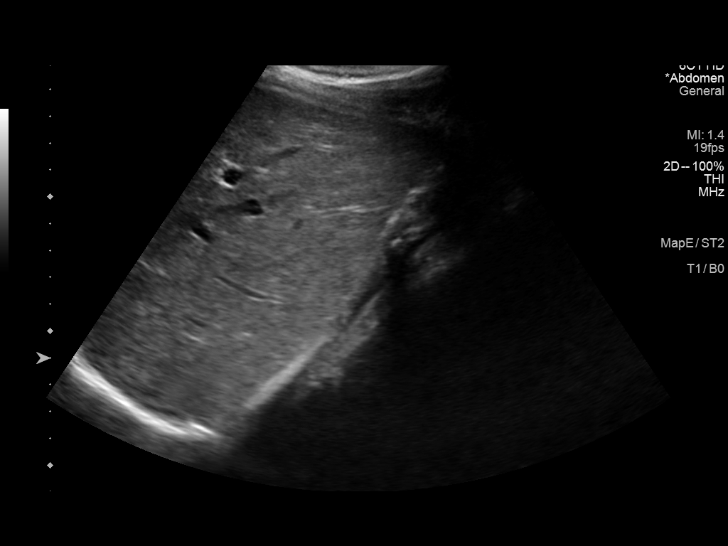
[im 41/45]
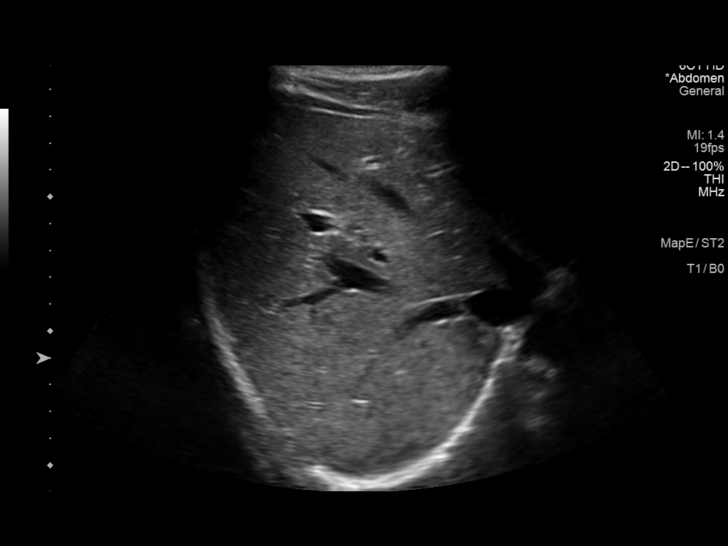
[im 45/45]
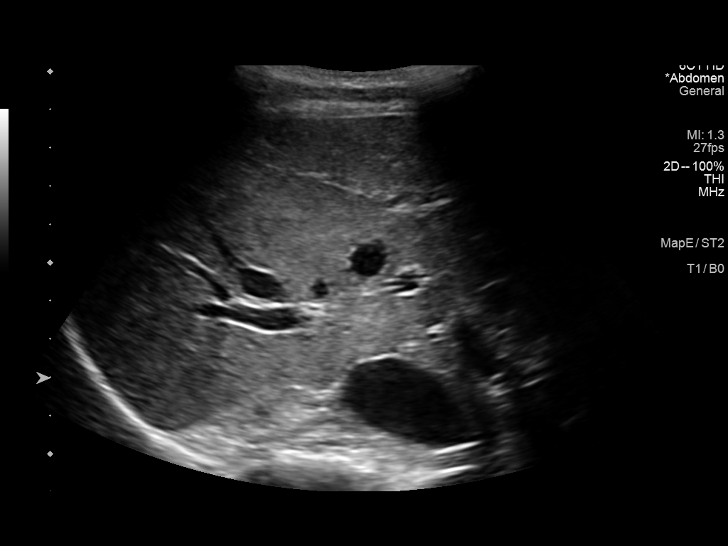

[14 of 25 positions shown; findings below may reference images not displayed]

FINDINGS: Gallbladder:

Physiologically distended. No gallstones or wall thickening
visualized. No gallbladder sludge or polyp. No sonographic Murphy
sign noted by sonographer.

Common bile duct:

Diameter: 3 mm, normal.

Liver:

No focal lesion identified. Within normal limits in parenchymal
echogenicity. Portal vein is patent on color Doppler imaging with
normal direction of blood flow towards the liver.

Other: No right upper quadrant ascites.
IMPRESSION: Normal right upper quadrant ultrasound. Particularly, normal
sonographic appearance of the gallbladder.

## 2024-04-23 ENCOUNTER — Other Ambulatory Visit: Payer: Self-pay | Admitting: Behavioral Health

## 2024-04-23 DIAGNOSIS — F5105 Insomnia due to other mental disorder: Secondary | ICD-10-CM

## 2024-05-11 ENCOUNTER — Telehealth: Payer: 59 | Admitting: Behavioral Health

## 2024-05-11 ENCOUNTER — Encounter: Payer: Self-pay | Admitting: Behavioral Health

## 2024-05-11 DIAGNOSIS — F5082 Avoidant/restrictive food intake disorder: Secondary | ICD-10-CM

## 2024-05-11 DIAGNOSIS — F5105 Insomnia due to other mental disorder: Secondary | ICD-10-CM

## 2024-05-11 DIAGNOSIS — F331 Major depressive disorder, recurrent, moderate: Secondary | ICD-10-CM

## 2024-05-11 DIAGNOSIS — F330 Major depressive disorder, recurrent, mild: Secondary | ICD-10-CM

## 2024-05-11 DIAGNOSIS — R636 Underweight: Secondary | ICD-10-CM

## 2024-05-11 DIAGNOSIS — F411 Generalized anxiety disorder: Secondary | ICD-10-CM

## 2024-05-11 MED ORDER — SERTRALINE HCL 100 MG PO TABS: 150.0000 mg | ORAL_TABLET | Freq: Every day | ORAL | 1 refills | Status: DC

## 2024-05-11 MED ORDER — MIRTAZAPINE 15 MG PO TABS: ORAL_TABLET | ORAL | 1 refills | Status: DC

## 2024-05-11 NOTE — Addendum Note (Signed)
 Addended by: TERESA ROGUE A on: 05/11/2024 09:25 AM   Modules accepted: Orders

## 2024-05-11 NOTE — Progress Notes (Signed)
 Kimberly Porter [id number] April 27, 1991 33 y.o.  Virtual Visit via Video Note  I connected with pt @ on 05/11/24 at  8:00 AM EDT by a video enabled telemedicine application and verified that I am speaking with the correct person using two identifiers.   I discussed the limitations of evaluation and management by telemedicine and the availability of in person appointments. The patient expressed understanding and agreed to proceed.  I discussed the assessment and treatment plan with the patient. The patient was provided an opportunity to ask questions and all were answered. The patient agreed with the plan and demonstrated an understanding of the instructions.   The patient was advised to call back or seek an in-person evaluation if the symptoms worsen or if the condition fails to improve as anticipated.  I provided 20 minutes of non-face-to-face time during this encounter.  The patient was located at home.  The provider was located at [facility].   Kimberly DELENA Pizza, NP   Subjective:   Patient ID:  Kimberly Porter is a 33 y.o. (DOB 1991/05/27) female.  Chief Complaint:  Chief Complaint  Patient presents with   Anxiety   Follow-up   Medication Refill   Patient Education   Depression    HPI  [place], 33 year old female presents to this for follow up and medication management.  Not much change since last visit. She continue with Zoloft  150 mg.  Continue to enjoy good period of stability. She continues with psychotherapy.  Requesting no medication changes today.  She says her anxiety is 1/10.  Her depression is 1/10.  Reports sleeping 7  hours  sleep per night.  She denies any history of mania, no psychosis, no history of auditory or visual hallucinations.  Denies SI or HI.  Past psychotropic medications: Doxepin Paxil Zoloft        Review of Systems:  Review of Systems  Constitutional: Negative.   Allergic/Immunologic: Negative.   Neurological: Negative.    Psychiatric/Behavioral: Negative.      Medications: I have reviewed the patient's current medications.  Current Outpatient Medications  Medication Sig Dispense Refill   mirtazapine  (REMERON ) 15 MG tablet TAKE 1 TABLET BY MOUTH NIGHTLY AT BEDTIME 90 tablet 1   sertraline  (ZOLOFT ) 100 MG tablet Take 1 tablet (100 mg total) by mouth daily. 45 tablet 3   sertraline  (ZOLOFT ) 100 MG tablet Take 1.5 tablets (150 mg total) by mouth daily. 135 tablet 1   valACYclovir (VALTREX) 500 MG tablet Take 500 mg by mouth 2 (two) times daily.     No current facility-administered medications for this visit.    Medication Side Effects: None  Allergies: No Known Allergies  History reviewed. No pertinent past medical history.  Family History  Problem Relation Age of Onset   Depression Mother    Anxiety disorder Mother    Alcohol abuse Father    ADD / ADHD Father    Drug abuse Paternal Aunt    Alcohol abuse Paternal Aunt    Anxiety disorder Maternal Grandfather    Heart disease Maternal Grandfather    Anxiety disorder Maternal Grandmother    Heart disease Maternal Grandmother    Drug abuse Paternal Grandmother    Alcohol abuse Paternal Grandmother     Social History   Socioeconomic History   Marital status: Single    Spouse name: Not on file   Number of children: Not on file   Years of education: 16   Highest education level: Bachelor's degree (e.g., BA, AB,  BS)  Occupational History   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 Enjoys cooking, being outdoors, enjoys [employer].   Social Drivers of Corporate investment banker Strain:  Not on file  Food Insecurity: Not on file  Transportation Needs: Not on file  Physical Activity: Not on file  Stress: Not on file  Social Connections: Not on file  Intimate Partner Violence: Not on file    Past Medical History, Surgical history, Social history, and Family history were reviewed and updated as appropriate.   Please see review of systems for further details on the patient's review from today.   Objective:   Physical Exam:  There were no vitals taken for this visit.  Physical Exam  Lab Review:  No results found for: NA, K, CL, CO2, GLUCOSE, BUN, CREATININE, CALCIUM, PROT, ALBUMIN, AST, ALT, ALKPHOS, BILITOT, GFRNONAA, GFRAA  No results found for: WBC, RBC, HGB, HCT, PLT, MCV, MCH, MCHC, RDW, LYMPHSABS, MONOABS, EOSABS, BASOSABS  No results found for: POCLITH, LITHIUM   No results found for: PHENYTOIN, PHENOBARB, VALPROATE, CBMZ   .res Assessment: Plan:    Greater than 50% of 30 min video visit with patient was spent on counseling and coordination of care. We discussed her continued stability.  No questions or concerns this visit. Job still going well since promotion.   We agreed to: Continue  Zoloft  150 mg daily.  To continue  Mirtazapine  15 mg 30 min before bedtime for sleep. She reports taking 1/2 tablet 7.5 mg. To report worsening symptoms or side effects promptly To follow up in 6 months to reassess Provided emergency contact information Reviewed PDMP.   Kimberly A. Abrahim Sargent, NP    Aleck Patient was seen today for anxiety, follow-up, medication refill, patient education and depression.  Diagnoses and all orders for this visit:  Mild episode of recurrent major depressive disorder (HCC)  Generalized anxiety disorder  Underweight  Avoidant-restrictive food intake disorder (ARFID)     Please see After Visit Summary for patient specific instructions.  No future  appointments.  No orders of the defined types were placed in this encounter.     -------------------------------

## 2024-05-23 ENCOUNTER — Other Ambulatory Visit: Payer: Self-pay | Admitting: Behavioral Health

## 2024-05-23 DIAGNOSIS — F411 Generalized anxiety disorder: Secondary | ICD-10-CM

## 2024-05-23 DIAGNOSIS — F331 Major depressive disorder, recurrent, moderate: Secondary | ICD-10-CM

## 2024-11-11 ENCOUNTER — Encounter: Payer: Self-pay | Admitting: Behavioral Health

## 2024-11-11 ENCOUNTER — Telehealth: Admitting: Behavioral Health

## 2024-11-11 DIAGNOSIS — F411 Generalized anxiety disorder: Secondary | ICD-10-CM | POA: Diagnosis not present

## 2024-11-11 DIAGNOSIS — F5105 Insomnia due to other mental disorder: Secondary | ICD-10-CM | POA: Diagnosis not present

## 2024-11-11 DIAGNOSIS — F99 Mental disorder, not otherwise specified: Secondary | ICD-10-CM

## 2024-11-11 DIAGNOSIS — F331 Major depressive disorder, recurrent, moderate: Secondary | ICD-10-CM

## 2024-11-11 MED ORDER — MIRTAZAPINE 15 MG PO TABS: ORAL_TABLET | ORAL | 1 refills | Status: AC

## 2024-11-11 MED ORDER — SERTRALINE HCL 100 MG PO TABS: 150.0000 mg | ORAL_TABLET | Freq: Every day | ORAL | 1 refills | Status: AC

## 2024-11-11 NOTE — Progress Notes (Signed)
 Kimberly Porter [id number] [birth year] 34 y.o.  Virtual Visit via Video Note  I connected with pt @ on 11/11/2024 at 10:00 AM EST by a video enabled telemedicine application and verified that I am speaking with the correct person using two identifiers.   I discussed the limitations of evaluation and management by telemedicine and the availability of in person appointments. The patient expressed understanding and agreed to proceed.  I discussed the assessment and treatment plan with the patient. The patient was provided an opportunity to ask questions and all were answered. The patient agreed with the plan and demonstrated an understanding of the instructions.   The patient was advised to call back or seek an in-person evaluation if the symptoms worsen or if the condition fails to improve as anticipated.  I provided 20 minutes of non-face-to-face time during this encounter.  The patient was located at home.  The provider was located at [facility].   Redell DELENA Pizza, NP   Subjective:   Patient ID:  Kimberly Porter is a 34 y.o. (DOB 03-21-1991) female.  Chief Complaint:  Chief Complaint  Patient presents with   Depression   Anxiety   Follow-up   Medication Refill   Patient Education    HPI Kimberly Porter, 34 year old female presents to this for follow up and medication management.  Not much change since last visit. She continue with Zoloft  150 mg.  Continue to enjoy good period of stability. She continues with psychotherapy.  Requesting no medication changes today.  She says her anxiety is 1/10.  Her depression is 1/10.  Reports sleeping 7  hours  sleep per night.  She denies any history of mania, no psychosis, no history of auditory or visual hallucinations.  Denies SI or HI.  Past psychotropic medications: Doxepin Paxil Zoloft      Review of Systems:  Review of Systems  Constitutional: Negative.   Allergic/Immunologic: Negative.   Neurological: Negative.    Psychiatric/Behavioral: Negative.      Medications: I have reviewed the patient's current medications.  Current Outpatient Medications  Medication Sig Dispense Refill   mirtazapine  (REMERON ) 15 MG tablet TAKE 1 TABLET BY MOUTH NIGHTLY AT BEDTIME 90 tablet 1   sertraline  (ZOLOFT ) 100 MG tablet Take 1.5 tablets (150 mg total) by mouth daily. 135 tablet 1   valACYclovir (VALTREX) 500 MG tablet Take 500 mg by mouth 2 (two) times daily.     No current facility-administered medications for this visit.    Medication Side Effects: None  Allergies: Allergies[1]  History reviewed. No pertinent past medical history.  Family History  Problem Relation Age of Onset   Depression Mother    Anxiety disorder Mother    Alcohol abuse Father    ADD / ADHD Father    Drug abuse Paternal Aunt    Alcohol abuse Paternal Aunt    Anxiety disorder Maternal Grandfather    Heart disease Maternal Grandfather    Anxiety disorder Maternal Grandmother    Heart disease Maternal Grandmother    Drug abuse Paternal Grandmother    Alcohol abuse Paternal Grandmother     Social History   Socioeconomic History   Marital status: Single    Spouse name: Not on file   Number of children: Not on file   Years of education: 16   Highest education level: Bachelor's degree (e.g., BA, AB, BS)  Occupational History   Occupation: Print production planner    Comment: [employer]  Tobacco Use   Smoking status: Never  Smokeless tobacco: Never  Vaping Use   Vaping status: Never Used  Substance and Sexual Activity   Alcohol use: Yes    Comment: light social drinker   Drug use: Never   Sexual activity: Not Currently  Other Topics Concern   Not on file  Social History Narrative   Lives with mother and grandmother. Enjoys cooking, being outdoors, enjoys [employer].   Social Drivers of Health   Tobacco Use: Low Risk (11/11/2024)   Patient History    Smoking Tobacco Use: Never    Smokeless Tobacco Use: Never    Passive  Exposure: Not on file  Financial Resource Strain: Not on file  Food Insecurity: Not on file  Transportation Needs: Not on file  Physical Activity: Not on file  Stress: Not on file  Social Connections: Not on file  Intimate Partner Violence: Not on file  Depression (PHQ2-9): High Risk (06/05/2022)   Depression (PHQ2-9)    PHQ-2 Score: 11  Alcohol Screen: Not on file  Housing: Not on file  Utilities: Not on file  Health Literacy: Not on file    Past Medical History, Surgical history, Social history, and Family history were reviewed and updated as appropriate.   Please see review of systems for further details on the patient's review from today.   Objective:   Physical Exam:  There were no vitals taken for this visit.  Physical Exam Neurological:     Mental Status: She is alert and oriented to person, place, and time.  Psychiatric:        Attention and Perception: Attention and perception normal.        Mood and Affect: Mood normal.        Speech: Speech normal.        Behavior: Behavior normal. Behavior is cooperative.        Cognition and Memory: Cognition and memory normal.        Judgment: Judgment normal.     Comments: Insight intact     Lab Review:  No results found for: NA, K, CL, CO2, GLUCOSE, BUN, CREATININE, CALCIUM, PROT, ALBUMIN, AST, ALT, ALKPHOS, BILITOT, GFRNONAA, GFRAA  No results found for: WBC, RBC, HGB, HCT, PLT, MCV, MCH, MCHC, RDW, LYMPHSABS, MONOABS, EOSABS, BASOSABS  No results found for: POCLITH, LITHIUM   No results found for: PHENYTOIN, PHENOBARB, VALPROATE, CBMZ   .res Assessment: Plan:    Greater than 50% of 30 min video visit with patient was spent on counseling and coordination of care. We discussed her continued stability.  No questions or concerns this visit. Job still going well since promotion.   We agreed to: Continue  Zoloft  150 mg daily.  To continue   Mirtazapine  15 mg 30 min before bedtime for sleep. She reports taking 1/2 tablet 7.5 mg. To report worsening symptoms or side effects promptly To follow up in 6 months to reassess Provided emergency contact information Reviewed PDMP.     Redell A. Quantrell Splitt, NP      Aleck Patient was seen today for depression, anxiety, follow-up, medication refill and patient education.  Diagnoses and all orders for this visit:  Insomnia due to other mental disorder -     mirtazapine  (REMERON ) 15 MG tablet; TAKE 1 TABLET BY MOUTH NIGHTLY AT BEDTIME  Generalized anxiety disorder -     sertraline  (ZOLOFT ) 100 MG tablet; Take 1.5 tablets (150 mg total) by mouth daily.  Major depressive disorder, recurrent episode, moderate (HCC) -     sertraline  (ZOLOFT ) 100  MG tablet; Take 1.5 tablets (150 mg total) by mouth daily.     Please see After Visit Summary for patient specific instructions.  No future appointments.  No orders of the defined types were placed in this encounter.     -------------------------------      [1] No Known Allergies

## 2024-11-20 ENCOUNTER — Other Ambulatory Visit: Payer: Self-pay | Admitting: Behavioral Health

## 2024-11-20 DIAGNOSIS — F411 Generalized anxiety disorder: Secondary | ICD-10-CM

## 2024-11-20 DIAGNOSIS — F331 Major depressive disorder, recurrent, moderate: Secondary | ICD-10-CM
